# Patient Record
Sex: Female | Born: 1995 | Hispanic: Yes | Marital: Single | Smoking: Former smoker
Health system: Southern US, Community
[De-identification: ages and names within clinical notes are randomized; demographics above are authoritative.]

## PROBLEM LIST (undated history)

## (undated) ENCOUNTER — Inpatient Hospital Stay (HOSPITAL_COMMUNITY): Payer: Self-pay

## (undated) DIAGNOSIS — Z789 Other specified health status: Secondary | ICD-10-CM

## (undated) DIAGNOSIS — R87629 Unspecified abnormal cytological findings in specimens from vagina: Secondary | ICD-10-CM

## (undated) DIAGNOSIS — O24419 Gestational diabetes mellitus in pregnancy, unspecified control: Secondary | ICD-10-CM

## (undated) DIAGNOSIS — F32A Depression, unspecified: Secondary | ICD-10-CM

## (undated) DIAGNOSIS — D649 Anemia, unspecified: Secondary | ICD-10-CM

## (undated) HISTORY — PX: BUNIONECTOMY: SHX129

---
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
## 2014-03-20 DIAGNOSIS — S42009A Fracture of unspecified part of unspecified clavicle, initial encounter for closed fracture: Secondary | ICD-10-CM

## 2014-03-20 DIAGNOSIS — S72009A Fracture of unspecified part of neck of unspecified femur, initial encounter for closed fracture: Secondary | ICD-10-CM

## 2014-03-20 HISTORY — DX: Fracture of unspecified part of unspecified clavicle, initial encounter for closed fracture: S42.009A

## 2014-03-20 HISTORY — DX: Fracture of unspecified part of neck of unspecified femur, initial encounter for closed fracture: S72.009A

## 2014-04-17 DIAGNOSIS — S32301A Unspecified fracture of right ilium, initial encounter for closed fracture: Secondary | ICD-10-CM | POA: Insufficient documentation

## 2014-04-17 DIAGNOSIS — S42022A Displaced fracture of shaft of left clavicle, initial encounter for closed fracture: Secondary | ICD-10-CM | POA: Insufficient documentation

## 2014-05-20 DIAGNOSIS — R87629 Unspecified abnormal cytological findings in specimens from vagina: Secondary | ICD-10-CM

## 2014-05-20 HISTORY — DX: Unspecified abnormal cytological findings in specimens from vagina: R87.629
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
## 2016-02-19 DIAGNOSIS — T1491XA Suicide attempt, initial encounter: Secondary | ICD-10-CM | POA: Insufficient documentation

## 2016-09-04 ENCOUNTER — Encounter (HOSPITAL_COMMUNITY): Payer: Self-pay | Admitting: *Deleted

## 2016-09-04 ENCOUNTER — Inpatient Hospital Stay (HOSPITAL_COMMUNITY)
Admission: AD | Admit: 2016-09-04 | Discharge: 2016-09-04 | Disposition: A | Discharge status: Home / Self Care | Payer: Self-pay | Source: Ambulatory Visit | Attending: Obstetrics & Gynecology | Admitting: Obstetrics & Gynecology

## 2016-09-04 ENCOUNTER — Inpatient Hospital Stay (HOSPITAL_COMMUNITY): Payer: Self-pay

## 2016-09-04 DIAGNOSIS — N760 Acute vaginitis: Secondary | ICD-10-CM

## 2016-09-04 DIAGNOSIS — B9689 Other specified bacterial agents as the cause of diseases classified elsewhere: Secondary | ICD-10-CM | POA: Insufficient documentation

## 2016-09-04 DIAGNOSIS — Z3491 Encounter for supervision of normal pregnancy, unspecified, first trimester: Secondary | ICD-10-CM

## 2016-09-04 DIAGNOSIS — O209 Hemorrhage in early pregnancy, unspecified: Secondary | ICD-10-CM | POA: Insufficient documentation

## 2016-09-04 DIAGNOSIS — Z3A08 8 weeks gestation of pregnancy: Secondary | ICD-10-CM | POA: Insufficient documentation

## 2016-09-04 DIAGNOSIS — O23591 Infection of other part of genital tract in pregnancy, first trimester: Secondary | ICD-10-CM | POA: Insufficient documentation

## 2016-09-04 HISTORY — DX: Other specified health status: Z78.9

## 2016-09-04 LAB — CBC
HCT: 38.1 % (ref 36.0–46.0)
Hemoglobin: 12.9 g/dL (ref 12.0–15.0)
MCH: 29.8 pg (ref 26.0–34.0)
MCHC: 33.9 g/dL (ref 30.0–36.0)
MCV: 88 fL (ref 78.0–100.0)
PLATELETS: 221 10*3/uL (ref 150–400)
RBC: 4.33 MIL/uL (ref 3.87–5.11)
RDW: 13.9 % (ref 11.5–15.5)
WBC: 8.1 10*3/uL (ref 4.0–10.5)

## 2016-09-04 LAB — WET PREP, GENITAL
Sperm: NONE SEEN
Trich, Wet Prep: NONE SEEN
Yeast Wet Prep HPF POC: NONE SEEN

## 2016-09-04 LAB — URINALYSIS, ROUTINE W REFLEX MICROSCOPIC
BACTERIA UA: NONE SEEN
Bilirubin Urine: NEGATIVE
GLUCOSE, UA: NEGATIVE mg/dL
Hgb urine dipstick: NEGATIVE
KETONES UR: NEGATIVE mg/dL
NITRITE: NEGATIVE
PROTEIN: NEGATIVE mg/dL
Specific Gravity, Urine: 1.017 (ref 1.005–1.030)
pH: 6 (ref 5.0–8.0)

## 2016-09-04 LAB — HCG, QUANTITATIVE, PREGNANCY: HCG, BETA CHAIN, QUANT, S: 22840 m[IU]/mL — AB (ref <5)

## 2016-09-04 LAB — POCT PREGNANCY, URINE: Preg Test, Ur: POSITIVE — AB

## 2016-09-04 LAB — ABO/RH: ABO/RH(D): O POS

## 2016-09-04 MED ORDER — METRONIDAZOLE 500 MG PO TABS: 500.0000 mg | ORAL_TABLET | Freq: Two times a day (BID) | ORAL | 0 refills | Status: DC

## 2016-09-04 NOTE — MAU Note (Addendum)
Pt started spotting at work this morning, bright red.  Is more light pink now.  Having lower abd pain since Monday.  Had pos HPT last week.

## 2016-09-04 NOTE — MAU Provider Note (Signed)
History     CSN: [id number]  Arrival date and time: 09/04/16 1618   First Provider Initiated Contact with Patient 09/04/16 1935      Chief Complaint  Patient presents with  . Vaginal Bleeding  . Abdominal Pain   HPI   Ms. Gina Lucas is a 21 y.o. female G1P0 @ [redacted]w[redacted]d here in MAU with vaginal bleeding. The bleeding started this morning at 0900. She describes the bleeding as light. This is the first episode of bleeding in this pregnancy. She denies recent intercourse. Some lower abdominal pain, worse in the center of her abdomen. She denies bleeding at this time.   OB History    Gravida Para Term Preterm AB Living   1             SAB TAB Ectopic Multiple Live Births                  Past Medical History:  Diagnosis Date  . Medical history non-contributory     Past Surgical History:  Procedure Laterality Date  . BUNIONECTOMY Right     History reviewed. No pertinent family history.  Social History  Substance Use Topics  . Smoking status: Never Smoker  . Smokeless tobacco: Never Used  . Alcohol use No    Allergies: No Known Allergies  Prescriptions Prior to Admission  Medication Sig Dispense Refill Last Dose  . HYDROcodone-acetaminophen (NORCO/VICODIN) 5-325 MG per tablet Take 1-2 tablets by mouth every 6 hours as needed for pain. 15 tablet 0    Results for orders placed or performed during the hospital encounter of 09/04/16 (from the past 48 hour(s))  Urinalysis, Routine w reflex microscopic     Status: Abnormal   Collection Time: 09/04/16  4:44 PM  Result Value Ref Range   Color, Urine YELLOW YELLOW   APPearance HAZY (A) CLEAR   Specific Gravity, Urine 1.017 1.005 - 1.030   pH 6.0 5.0 - 8.0   Glucose, UA NEGATIVE NEGATIVE mg/dL   Hgb urine dipstick NEGATIVE NEGATIVE   Bilirubin Urine NEGATIVE NEGATIVE   Ketones, ur NEGATIVE NEGATIVE mg/dL   Protein, ur NEGATIVE NEGATIVE mg/dL   Nitrite NEGATIVE NEGATIVE   Leukocytes, UA TRACE (A) NEGATIVE   RBC / HPF  0-5 0 - 5 RBC/hpf   WBC, UA 0-5 0 - 5 WBC/hpf   Bacteria, UA NONE SEEN NONE SEEN   Squamous Epithelial / LPF 0-5 (A) NONE SEEN  Pregnancy, urine POC     Status: Abnormal   Collection Time: 09/04/16  5:00 PM  Result Value Ref Range   Preg Test, Ur POSITIVE (A) NEGATIVE    Comment:        THE SENSITIVITY OF THIS METHODOLOGY IS >24 mIU/mL   CBC     Status: None   Collection Time: 09/04/16  5:39 PM  Result Value Ref Range   WBC 8.1 4.0 - 10.5 K/uL   RBC 4.33 3.87 - 5.11 MIL/uL   Hemoglobin 12.9 12.0 - 15.0 g/dL   HCT 40.9 81.1 - 91.4 %   MCV 88.0 78.0 - 100.0 fL   MCH 29.8 26.0 - 34.0 pg   MCHC 33.9 30.0 - 36.0 g/dL   RDW 78.2 95.6 - 21.3 %   Platelets 221 150 - 400 K/uL  ABO/Rh     Status: None (Preliminary result)   Collection Time: 09/04/16  5:39 PM  Result Value Ref Range   ABO/RH(D) O POS   hCG, quantitative, pregnancy  Status: Abnormal   Collection Time: 09/04/16  5:39 PM  Result Value Ref Range   hCG, Beta Chain, Quant, S 22,840 (H) <5 mIU/mL    Comment:          GEST. AGE      CONC.  (mIU/mL)   <=1 WEEK        5 - 50     2 WEEKS       50 - 500     3 WEEKS       100 - 10,000     4 WEEKS     1,000 - 30,000     5 WEEKS     3,500 - 115,000   6-8 WEEKS     12,000 - 270,000    12 WEEKS     15,000 - 220,000        FEMALE AND NON-PREGNANT FEMALE:     LESS THAN 5 mIU/mL   Wet prep, genital     Status: Abnormal   Collection Time: 09/04/16  7:24 PM  Result Value Ref Range   Yeast Wet Prep HPF POC NONE SEEN NONE SEEN   Trich, Wet Prep NONE SEEN NONE SEEN   Clue Cells Wet Prep HPF POC PRESENT (A) NONE SEEN   WBC, Wet Prep HPF POC MANY (A) NONE SEEN    Comment: MODERATE BACTERIA SEEN   Sperm NONE SEEN    Review of Systems  Gastrointestinal: Positive for abdominal pain. Negative for nausea and vomiting.  Genitourinary: Negative for vaginal bleeding (None currently ).   Physical Exam   Blood pressure 129/68, pulse 86, temperature 98.2 F (36.8 C), temperature source  Oral, resp. rate 18, height  (1.702 m), weight 264 lb (119.7 kg), last menstrual period 07/04/2016.  Physical Exam  Constitutional: She is oriented to person, place, and time. She appears well-developed and well-nourished. No distress.  HENT:  Head: Normocephalic.  Eyes: Pupils are equal, round, and reactive to light.  Respiratory: Effort normal.  GI: Soft.  Genitourinary:  Genitourinary Comments: Bimanual exam: Cervix closed, anterior. Uterus non tender, enlarged  GC/Chlam, wet prep done collected by RN.  Chaperone present for exam.   Musculoskeletal: Normal range of motion.  Neurological: She is alert and oriented to person, place, and time.  Skin: Skin is warm. She is not diaphoretic.  Psychiatric: Her behavior is normal.    MAU Course  Procedures  US Ob Comp Less 14 Wks  Result Date: 09/04/2016 CLINICAL DATA:  21 year old pregnant female with vaginal bleeding and pelvic pain. Estimated gestational age of [redacted] weeks 6 days by LMP. Beta HCG of 22,000. EXAM: OBSTETRIC <14 WK [place] AND TRANSVAGINAL OB US TECHNIQUE: Both transabdominal and transvaginal ultrasound examinations were performed for complete evaluation of the gestation as well as the maternal uterus, adnexal regions, and pelvic cul-de-sac. Transvaginal technique was performed to assess early pregnancy. COMPARISON:  None. FINDINGS: Intrauterine gestational sac: Single Yolk sac:  Visualized. Embryo:  Visualized. Cardiac Activity: Visualized. Heart Rate: 138  bpm CRL:  14  mm   7 w   4 d                  [place] EDC: 04/19/2017 Subchorionic hemorrhage:  None visualized. Maternal uterus/adnexae: The ovaries bilaterally are unremarkable. There is no evidence of free fluid or adnexal mass. IMPRESSION: Single living intrauterine gestation with estimated gestational age of [redacted] weeks 4 days by this ultrasound. No evidence of subchorionic hemorrhage. Normal ovaries.  No adnexal mass or  free fluid. Electronically Signed   By: Harmon Pier M.D.   On:  09/04/2016 20:15   US Ob Transvaginal  Result Date: 09/04/2016 CLINICAL DATA:  21 year old pregnant female with vaginal bleeding and pelvic pain. Estimated gestational age of [redacted] weeks 6 days by LMP. Beta HCG of 22,000. EXAM: OBSTETRIC <14 WK [place] AND TRANSVAGINAL OB US TECHNIQUE: Both transabdominal and transvaginal ultrasound examinations were performed for complete evaluation of the gestation as well as the maternal uterus, adnexal regions, and pelvic cul-de-sac. Transvaginal technique was performed to assess early pregnancy. COMPARISON:  None. FINDINGS: Intrauterine gestational sac: Single Yolk sac:  Visualized. Embryo:  Visualized. Cardiac Activity: Visualized. Heart Rate: 138  bpm CRL:  14  mm   7 w   4 d                  [place] EDC: 04/19/2017 Subchorionic hemorrhage:  None visualized. Maternal uterus/adnexae: The ovaries bilaterally are unremarkable. There is no evidence of free fluid or adnexal mass. IMPRESSION: Single living intrauterine gestation with estimated gestational age of [redacted] weeks 4 days by this ultrasound. No evidence of subchorionic hemorrhage. Normal ovaries.  No adnexal mass or free fluid. Electronically Signed   By: Harmon Pier M.D.   On: 09/04/2016 20:15     MDM  Cbc, Conni Elliot, Quant [place] ordered [employer] prep and GC  Report given to Judeth Horn NP who resumes care of the patient. Patient awaiting [place].  Duane Lope, NP  Ultrasound shows SIUP @ [redacted]w[redacted]d with cardiac activity Will tx for BV O positive Assessment and Plan  A: 1. Normal IUP (intrauterine pregnancy) on prenatal ultrasound, first trimester   2. Vaginal bleeding in pregnancy, first trimester   3. BV (bacterial vaginosis)    P: Discharge home Rx flagyl Discussed reasons to return to MAU Keep follow up appointment with OB/PCP  GC/CT & urine cx pending  Judeth Horn, NP

## 2016-09-04 NOTE — Discharge Instructions (Signed)
[facility] Prenatal Care Providers   Center for [facility] at [facility]       Phone: [phone]  Center for [facility] [place] Phone: [phone]  Center for [facility] at [place]  Phone: [phone]  Center for [facility] at [facility]  Phone: [phone]  Center for [facility] at [facility]  Phone: [phone]  [facility]       Phone: [phone]  [facility]    Phone: [phone]   [facility])    Phone: [phone]  [facility]    Phone: [phone]  [facility]    Phone: [phone]   [facility]  Phone: [phone]  [facility]    Phone: [phone]  [facility]   Phone: [phone]  [facility]    Phone: [phone]    Bacterial Vaginosis Bacterial vaginosis is a vaginal infection that occurs when the normal balance of bacteria in the vagina is disrupted. It results from an overgrowth of certain bacteria. This is the most common vaginal infection among women ages 54-44. Because bacterial vaginosis increases your risk for STIs (sexually transmitted infections), getting treated can help reduce your risk for chlamydia, gonorrhea, herpes, and HIV (human immunodeficiency virus). Treatment is also important for preventing complications in pregnant women, because this condition can cause an early (premature) delivery. What are the causes? This condition is caused by an increase in harmful bacteria that are normally present in small amounts in the vagina. However, the reason that the condition develops is not fully understood. What increases the risk? The following factors may make you more likely to develop this condition:  Having a new sexual partner or multiple sexual partners.  Having unprotected  sex.  Douching.  Having an intrauterine device (IUD).  Smoking.  Drug and alcohol abuse.  Taking certain antibiotic medicines.  Being pregnant. You cannot get bacterial vaginosis from toilet seats, bedding, swimming pools, or contact with objects around you. What are the signs or symptoms? Symptoms of this condition include:  Grey or white vaginal discharge. The discharge can also be watery or foamy.  A fish-like odor with discharge, especially after sexual intercourse or during menstruation.  Itching in and around the vagina.  Burning or pain with urination. Some women with bacterial vaginosis have no signs or symptoms. How is this diagnosed? This condition is diagnosed based on:  Your medical history.  A physical exam of the vagina.  Testing a sample of vaginal fluid under a microscope to look for a large amount of bad bacteria or abnormal cells. Your health care provider may use a cotton swab or a small wooden spatula to collect the sample. How is this treated? This condition is treated with antibiotics. These may be given as a pill, a vaginal cream, or a medicine that is put into the vagina (suppository). If the condition comes back after treatment, a second round of antibiotics may be needed. Follow these instructions at home: Medicines   Take over-the-counter and prescription medicines only as told by your health care provider.  Take or use your antibiotic as told by your health care provider. Do not stop taking or using the antibiotic even if you start to feel better. General instructions   If you have a female sexual partner, tell her that you have a vaginal infection. She should see her health care provider and be treated if she has symptoms.  If you have a female sexual partner, he does not need treatment.  During treatment:  Avoid sexual activity until you finish treatment.  Do not douche.  Avoid alcohol as directed by your health care provider.  Avoid  breastfeeding as directed by your health care provider.  Drink enough water and fluids to keep your urine clear or pale yellow.  Keep the area around your vagina and rectum clean.  Wash the area daily with warm water.  Wipe yourself from front to back after using the toilet.  Keep all follow-up visits as told by your health care provider. This is important. How is this prevented?  Do not douche.  Wash the outside of your vagina with warm water only.  Use protection when having sex. This includes latex condoms and dental dams.  Limit how many sexual partners you have. To help prevent bacterial vaginosis, it is best to have sex with just one partner (monogamous).  Make sure you and your sexual partner are tested for STIs.  Wear cotton or cotton-lined underwear.  Avoid wearing tight pants and pantyhose, especially during summer.  Limit the amount of alcohol that you drink.  Do not use any products that contain nicotine or tobacco, such as cigarettes and e-cigarettes. If you need help quitting, ask your health care provider.  Do not use illegal drugs. Where to find more information:  Centers for Disease Control and Prevention: SolutionApps.co.za  American Sexual Health Association (ASHA): www.ashastd.org  U.S. Department of Health and Health and safety inspector, Office on Women's Health: ConventionalMedicines.si or http://www.anderson-williamson.info/ Contact a health care provider if:  Your symptoms do not improve, even after treatment.  You have more discharge or pain when urinating.  You have a fever.  You have pain in your abdomen.  You have pain during sex.  You have vaginal bleeding between periods. Summary  Bacterial vaginosis is a vaginal infection that occurs when the normal balance of bacteria in the vagina is disrupted.  Because bacterial vaginosis increases your risk for STIs (sexually transmitted infections), getting treated can help reduce your  risk for chlamydia, gonorrhea, herpes, and HIV (human immunodeficiency virus). Treatment is also important for preventing complications in pregnant women, because the condition can cause an early (premature) delivery.  This condition is treated with antibiotic medicines. These may be given as a pill, a vaginal cream, or a medicine that is put into the vagina (suppository). This information is not intended to replace advice given to you by your health care provider. Make sure you discuss any questions you have with your health care provider. Document Released: 05/06/2005 Document Revised: 01/20/2016 Document Reviewed: 01/20/2016 Elsevier Interactive Patient Education  2017 [employer].

## 2016-09-05 LAB — GC/CHLAMYDIA PROBE AMP (~~LOC~~) NOT AT ARMC
Chlamydia: NEGATIVE
Neisseria Gonorrhea: NEGATIVE

## 2016-09-05 LAB — RPR: RPR: NONREACTIVE

## 2016-09-05 LAB — HIV ANTIBODY (ROUTINE TESTING W REFLEX): HIV SCREEN 4TH GENERATION: NONREACTIVE

## 2016-09-06 LAB — CULTURE, OB URINE
Culture: NO GROWTH
Special Requests: NORMAL

## 2016-09-20 ENCOUNTER — Encounter (HOSPITAL_COMMUNITY): Payer: Self-pay | Admitting: *Deleted

## 2016-09-20 ENCOUNTER — Inpatient Hospital Stay (HOSPITAL_COMMUNITY): Payer: Self-pay

## 2016-09-20 ENCOUNTER — Inpatient Hospital Stay (HOSPITAL_COMMUNITY)
Admission: AD | Admit: 2016-09-20 | Discharge: 2016-09-20 | Disposition: A | Discharge status: Home / Self Care | Payer: Self-pay | Source: Ambulatory Visit | Attending: Obstetrics and Gynecology | Admitting: Obstetrics and Gynecology

## 2016-09-20 DIAGNOSIS — O209 Hemorrhage in early pregnancy, unspecified: Secondary | ICD-10-CM | POA: Insufficient documentation

## 2016-09-20 DIAGNOSIS — O4690 Antepartum hemorrhage, unspecified, unspecified trimester: Secondary | ICD-10-CM

## 2016-09-20 DIAGNOSIS — Z3A09 9 weeks gestation of pregnancy: Secondary | ICD-10-CM | POA: Insufficient documentation

## 2016-09-20 DIAGNOSIS — Z6790 Unspecified blood type, Rh positive: Secondary | ICD-10-CM

## 2016-09-20 DIAGNOSIS — O021 Missed abortion: Secondary | ICD-10-CM | POA: Insufficient documentation

## 2016-09-20 LAB — CBC
HCT: 39.5 % (ref 36.0–46.0)
Hemoglobin: 13.3 g/dL (ref 12.0–15.0)
MCH: 29.5 pg (ref 26.0–34.0)
MCHC: 33.7 g/dL (ref 30.0–36.0)
MCV: 87.6 fL (ref 78.0–100.0)
Platelets: 228 10*3/uL (ref 150–400)
RBC: 4.51 MIL/uL (ref 3.87–5.11)
RDW: 13.5 % (ref 11.5–15.5)
WBC: 8.9 10*3/uL (ref 4.0–10.5)

## 2016-09-20 LAB — HCG, QUANTITATIVE, PREGNANCY: hCG, Beta Chain, Quant, S: 7444 m[IU]/mL — ABNORMAL HIGH (ref <5)

## 2016-09-20 MED ORDER — ACETAMINOPHEN-CODEINE #3 300-30 MG PO TABS: 1.0000 | ORAL_TABLET | Freq: Four times a day (QID) | ORAL | 0 refills | Status: DC | PRN

## 2016-09-20 MED ORDER — ACETAMINOPHEN-CODEINE #3 300-30 MG PO TABS
1.0000 | ORAL_TABLET | Freq: Four times a day (QID) | ORAL | Status: DC | PRN
  Administered 2016-09-20: 2 via ORAL
  Filled 2016-09-20: qty 2

## 2016-09-20 MED ORDER — IBUPROFEN 600 MG PO TABS
600.0000 mg | ORAL_TABLET | Freq: Four times a day (QID) | ORAL | Status: DC | PRN
  Administered 2016-09-20: 600 mg via ORAL
  Filled 2016-09-20: qty 1

## 2016-09-20 MED ORDER — IBUPROFEN 600 MG PO TABS: 600.0000 mg | ORAL_TABLET | Freq: Four times a day (QID) | ORAL | 0 refills | Status: DC | PRN

## 2016-09-20 MED ORDER — MISOPROSTOL 200 MCG PO TABS
800.0000 ug | ORAL_TABLET | Freq: Once | ORAL | Status: AC
  Administered 2016-09-20: 800 ug via RECTAL
  Filled 2016-09-20: qty 4

## 2016-09-20 MED ORDER — PROMETHAZINE HCL 25 MG PO TABS
25.0000 mg | ORAL_TABLET | Freq: Four times a day (QID) | ORAL | 0 refills | Status: DC | PRN
Start: 2016-09-20 — End: 2018-12-02

## 2016-09-20 NOTE — MAU Note (Signed)
c/o intermittent pink spottingfor past 3 days with bleeding starting around 1200 yesterday;  c/o cramping that started yesterday with pain increasing with ambulation;

## 2016-09-20 NOTE — Discharge Instructions (Signed)

## 2016-09-20 NOTE — MAU Provider Note (Signed)
History     CSN: [id number]  Arrival date and time: 09/20/16 4098  Chief Complaint  Patient presents with  . Vaginal Bleeding   G1 @[redacted]w[redacted]d  here with VB. She reports spotting for the last 3 days then began bleeding yesterday. This morning bleeding has increased. She also reports cramping that started yesterday and has continued today. She has not used anything for the pain. Admits to IC about 2 days ago. She was seen about 2 weeks ago and had a viable IUP consistent with dates and no [facility].     OB History    Gravida Para Term Preterm AB Living   1             SAB TAB Ectopic Multiple Live Births                  Past Medical History:  Diagnosis Date  . Medical history non-contributory     Past Surgical History:  Procedure Laterality Date  . BUNIONECTOMY Right     No family history on file.  Social History  Substance Use Topics  . Smoking status: Never Smoker  . Smokeless tobacco: Never Used  . Alcohol use No    Allergies: No Known Allergies  Prescriptions Prior to Admission  Medication Sig Dispense Refill Last Dose  . Prenatal Vit-Fe Fumarate-FA (PRENATAL MULTIVITAMIN) TABS tablet Take 1 tablet by mouth daily at 12 noon.   Past Week at Unknown time  . metroNIDAZOLE (FLAGYL) 500 MG tablet Take 1 tablet (500 mg total) by mouth 2 (two) times daily. (Patient not taking: Reported on 09/20/2016) 14 tablet 0 Not Taking at Unknown time    Review of Systems  Constitutional: Negative.   Gastrointestinal: Positive for abdominal pain (cramping).  Genitourinary: Positive for vaginal bleeding.   Physical Exam   Blood pressure 126/75, pulse 89, temperature 98.6 F (37 C), temperature source Oral, resp. rate 18, last menstrual period 07/04/2016.  Physical Exam  Nursing note and vitals reviewed. Constitutional: She is oriented to person, place, and time. She appears well-developed and well-nourished. No distress.  HENT:  Head: Normocephalic and atraumatic.  Neck: Normal range  of motion.  Cardiovascular: Normal rate.   Respiratory: Effort normal.  GI: Soft. She exhibits no distension and no mass. There is no tenderness. There is no rebound and no guarding.  Genitourinary:  Genitourinary Comments: External: no lesions or erythema Vagina: rugated, nulli, small drk red discharge, small bits of grey tissue present Cervix closed/long   Musculoskeletal: Normal range of motion.  Neurological: She is alert and oriented to person, place, and time.  Skin: Skin is warm and dry.  Psychiatric: She has a normal mood and affect.   Results for orders placed or performed during the hospital encounter of 09/20/16 (from the past 24 hour(s))  CBC     Status: None   Collection Time: 09/20/16  8:48 AM  Result Value Ref Range   WBC 8.9 4.0 - 10.5 K/uL   RBC 4.51 3.87 - 5.11 MIL/uL   Hemoglobin 13.3 12.0 - 15.0 g/dL   HCT 11.9 14.7 - 82.9 %   MCV 87.6 78.0 - 100.0 fL   MCH 29.5 26.0 - 34.0 pg   MCHC 33.7 30.0 - 36.0 g/dL   RDW 56.2 13.0 - 86.5 %   Platelets 228 150 - 400 K/uL   US Ob Transvaginal  Result Date: 09/20/2016 CLINICAL DATA:  Early pregnancy, bleeding EXAM: TRANSVAGINAL OB ULTRASOUND TECHNIQUE: Transvaginal ultrasound was performed for complete evaluation of  the gestation as well as the maternal uterus, adnexal regions, and pelvic cul-de-sac. COMPARISON:  09/04/2016 FINDINGS: Intrauterine gestational sac: Single Yolk sac:  Not visualized Embryo:  Visualized Cardiac Activity: Not visualized Heart Rate:  bpm MSD:   mm    w     d CRL:   15  mm   7 w 5 d                  US EDC: 05/04/2017 Subchorionic hemorrhage:  None visualized. Maternal uterus/adnexae: No adnexal mass.  Trace free fluid. IMPRESSION: No progression of the intrauterine gestation since prior study 2-3 weeks ago. No detectable cardiac activity. Findings meet definitive criteria for failed pregnancy. This follows SRU consensus guidelines: Diagnostic Criteria for Nonviable Pregnancy Early in the First Trimester.  Macy Mis Engl J Med [phone]. Electronically Signed   By: Charlett NoseKevin  Dover M.D.   On: 09/20/2016 09:30    MAU Course  Procedures  MDM Labs and US ordered and reviewed. US consistent with failed pregnancy. Discussed options with pt and SO, she prefers medical mngt.  Early Intrauterine Pregnancy Failure Protocol X  Documented intrauterine pregnancy failure less than or equal to [redacted] weeks   gestation  X  No serious current illness  X  Baseline Hgb greater than or equal to 10g/dl  X  Patient has easily accessible transportation to the hospital  X  Clear preference  X  Practitioner/physician deems patient reliable  X  Counseling by practitioner or physician  X  Patient education by RN  X  Consent form signed       Rho-Gam given by RN if indicated  X  Medication dispensed  X  Cytotec 800 mcg        Intravaginally by NP in MAU       Rectally by patient at home   X   Rectally by RN in MAU  X   Ibuprofen 600 mg 1 tablet by mouth every 6 hours as needed #30 - prescribed  X   Tylenol #3 mg by mouth every 4 to 6 hours as needed - prescribed  X   Phenergan 12.5 mg by mouth every 4 hours as needed for nausea - prescribed  Reviewed with pt cytotec procedure.  Pt verbalizes that she lives close to the hospital and has transportation readily available. Pt appears reliable and verbalizes understanding and agrees with plan of care  Assessment and Plan   1. Missed abortion   2. Vaginal bleeding in pregnancy   3. Blood type, Rh positive    Discharge home Follow up in [facility] in 2 weeks Bleeding/return precautions Rx Tylenol #3 Rx Ibuprofen Rx Phenergan  Allergies as of 09/20/2016   No Known Allergies     Medication List    STOP taking these medications   metroNIDAZOLE 500 MG tablet Commonly known as:  FLAGYL     TAKE these medications   acetaminophen-codeine 300-30 MG tablet Commonly known as:  TYLENOL #3 Take 1-2 tablets by mouth every 6 (six) hours as needed for moderate pain.    ibuprofen 600 MG tablet Commonly known as:  ADVIL,MOTRIN Take 1 tablet (600 mg total) by mouth every 6 (six) hours as needed for cramping.   prenatal multivitamin Tabs tablet Take 1 tablet by mouth daily at 12 noon.   promethazine 25 MG tablet Commonly known as:  PHENERGAN Take 1 tablet (25 mg total) by mouth every 6 (six) hours as needed for nausea or vomiting.  Donette Larry, CNM 09/20/2016, 9:22 AM

## 2016-09-21 ENCOUNTER — Inpatient Hospital Stay (HOSPITAL_COMMUNITY): Payer: Self-pay

## 2016-09-21 ENCOUNTER — Encounter (HOSPITAL_COMMUNITY): Payer: Self-pay | Admitting: *Deleted

## 2016-09-21 ENCOUNTER — Inpatient Hospital Stay (HOSPITAL_COMMUNITY)
Admission: AD | Admit: 2016-09-21 | Discharge: 2016-09-21 | Disposition: A | Discharge status: Home / Self Care | Payer: Self-pay | Source: Ambulatory Visit | Attending: Obstetrics and Gynecology | Admitting: Obstetrics and Gynecology

## 2016-09-21 DIAGNOSIS — O031 Delayed or excessive hemorrhage following incomplete spontaneous abortion: Secondary | ICD-10-CM | POA: Insufficient documentation

## 2016-09-21 DIAGNOSIS — O209 Hemorrhage in early pregnancy, unspecified: Secondary | ICD-10-CM

## 2016-09-21 DIAGNOSIS — IMO0002 Reserved for concepts with insufficient information to code with codable children: Secondary | ICD-10-CM

## 2016-09-21 DIAGNOSIS — O034 Incomplete spontaneous abortion without complication: Secondary | ICD-10-CM

## 2016-09-21 LAB — CBC
HCT: 37.5 % (ref 36.0–46.0)
HEMOGLOBIN: 12.7 g/dL (ref 12.0–15.0)
MCH: 30 pg (ref 26.0–34.0)
MCHC: 33.9 g/dL (ref 30.0–36.0)
MCV: 88.4 fL (ref 78.0–100.0)
Platelets: 226 10*3/uL (ref 150–400)
RBC: 4.24 MIL/uL (ref 3.87–5.11)
RDW: 13.5 % (ref 11.5–15.5)
WBC: 8.7 10*3/uL (ref 4.0–10.5)

## 2016-09-21 MED ORDER — MISOPROSTOL 200 MCG PO TABS
800.0000 ug | ORAL_TABLET | Freq: Once | ORAL | Status: AC
  Administered 2016-09-21: 800 ug via BUCCAL
  Filled 2016-09-21: qty 4

## 2016-09-21 NOTE — MAU Note (Signed)
Pt was here earlier today and given Cytotec due to baby stopped growing. Bleeding now with mod clot and soaked 3 pads in an hour. Was told to return if had large clots

## 2016-09-21 NOTE — Progress Notes (Signed)
Lisa Leftwich Kirby CNM in earlier to discuss d/c plan. Written and verbal d/c instructions given and understanding voiced. 

## 2016-09-21 NOTE — MAU Provider Note (Signed)
Chief Complaint: Vaginal Bleeding   First Provider Initiated Contact with Patient 09/21/16 [phone]      SUBJECTIVE HPI: Gina Lucas is a 21 y.o. G1P0 at [redacted]w[redacted]d by LMP who presents to maternity admissions reporting heavy vaginal bleeding with large clots following Cytotec administration yesterday in MAU for missed ab.  She reports the bleeding started soon after the medication was given and was like a heavy period but for the past few hours it has become heavy, soaking 3 pads in one hour with golf ball sized clots. She reports mild  abdominal cramping but denies n/v, dizziness, or other associated symptoms.  She has not tried any treatments.  Nothing makes the bleeding better or worse. She denies vaginal itching/burning, urinary symptoms, h/a, dizziness, n/v, or fever/chills.     HPI  Past Medical History:  Diagnosis Date  . Medical history non-contributory    Past Surgical History:  Procedure Laterality Date  . BUNIONECTOMY Right    Social History   Social History  . Marital status: Single    Spouse name: N/A  . Number of children: N/A  . Years of education: N/A   Occupational History  . Not on file.   Social History Main Topics  . Smoking status: Never Smoker  . Smokeless tobacco: Never Used  . Alcohol use No  . Drug use: No  . Sexual activity: Yes   Other Topics Concern  . Not on file   Social History Narrative  . No narrative on file   No current facility-administered medications on file prior to encounter.    Current Outpatient Prescriptions on File Prior to Encounter  Medication Sig Dispense Refill  . Prenatal Vit-Fe Fumarate-FA (PRENATAL MULTIVITAMIN) TABS tablet Take 1 tablet by mouth daily at 12 noon.    Marland Kitchen acetaminophen-codeine (TYLENOL #3) 300-30 MG tablet Take 1-2 tablets by mouth every 6 (six) hours as needed for moderate pain. 10 tablet 0  . ibuprofen (ADVIL,MOTRIN) 600 MG tablet Take 1 tablet (600 mg total) by mouth every 6 (six) hours as needed for  cramping. 30 tablet 0  . promethazine (PHENERGAN) 25 MG tablet Take 1 tablet (25 mg total) by mouth every 6 (six) hours as needed for nausea or vomiting. 30 tablet 0   No Known Allergies  ROS:  Review of Systems  Constitutional: Negative for chills, fatigue and fever.  Respiratory: Negative for shortness of breath.   Cardiovascular: Negative for chest pain.  Gastrointestinal: Negative for nausea and vomiting.  Genitourinary: Positive for vaginal bleeding. Negative for difficulty urinating, dysuria, flank pain, pelvic pain, vaginal discharge and vaginal pain.  Neurological: Negative for dizziness and headaches.  Psychiatric/Behavioral: Negative.      I have reviewed patient's Past Medical Hx, Surgical Hx, Family Hx, Social Hx, medications and allergies.   Physical Exam   Patient Vitals for the past 24 hrs:  BP Temp Pulse Resp Height Weight  09/21/16 0630 (!) 113/55 97.7 F (36.5 C) 72 18 - -  09/21/16 0352 133/62 98.1 F (36.7 C) 82 20 5\' 7"  (1.702 m) 266 lb (120.7 kg)   Constitutional: Well-developed, well-nourished female in no acute distress.  Cardiovascular: normal rate Respiratory: normal effort GI: Abd soft, non-tender. Pos BS x 4 MS: Extremities nontender, no edema, normal ROM Neurologic: Alert and oriented x 4.  GU: Neg CVAT.  PELVIC EXAM: Heavy bleeding with large clots noted during speculum exam, large 3-4cm gray/white material, likely POCs removed from cervical os during exam.  Bleeding continued following removal of  clot/POCs but no more clots noted.    After pelvic exam and US, pt bleeding is still moderate, but soaking 1 pad in ~ 1 hour in MAU.     LAB RESULTS Results for orders placed or performed during the hospital encounter of 09/21/16 (from the past 24 hour(s))  CBC     Status: None   Collection Time: 09/21/16  4:51 AM  Result Value Ref Range   WBC 8.7 4.0 - 10.5 K/uL   RBC 4.24 3.87 - 5.11 MIL/uL   Hemoglobin 12.7 12.0 - 15.0 g/dL   HCT 45.437.5 09.836.0 -  11.946.0 %   MCV 88.4 78.0 - 100.0 fL   MCH 30.0 26.0 - 34.0 pg   MCHC 33.9 30.0 - 36.0 g/dL   RDW 14.713.5 82.911.5 - 56.215.5 %   Platelets 226 150 - 400 K/uL    --/--/O POS (04/18 1739)  IMAGING [place] Ob Comp Less 14 Wks  Result Date: 09/04/2016 CLINICAL DATA:  21 year old pregnant female with vaginal bleeding and pelvic pain. Estimated gestational age of [redacted] weeks 6 days by LMP. Beta HCG of 22,000. EXAM: OBSTETRIC <14 WK US AND TRANSVAGINAL OB US TECHNIQUE: Both transabdominal and transvaginal ultrasound examinations were performed for complete evaluation of the gestation as well as the maternal uterus, adnexal regions, and pelvic cul-de-sac. Transvaginal technique was performed to assess early pregnancy. COMPARISON:  None. FINDINGS: Intrauterine gestational sac: Single Yolk sac:  Visualized. Embryo:  Visualized. Cardiac Activity: Visualized. Heart Rate: 138  bpm CRL:  14  mm   7 w   4 d                  US EDC: 04/19/2017 Subchorionic hemorrhage:  None visualized. Maternal uterus/adnexae: The ovaries bilaterally are unremarkable. There is no evidence of free fluid or adnexal mass. IMPRESSION: Single living intrauterine gestation with estimated gestational age of [redacted] weeks 4 days by this ultrasound. No evidence of subchorionic hemorrhage. Normal ovaries.  No adnexal mass or free fluid. Electronically Signed   By: Harmon PierJeffrey  Hu M.D.   On: 09/04/2016 20:15   [place] Ob Transvaginal  Result Date: 09/21/2016 CLINICAL DATA:  Vaginal bleeding, status post Cytotec yesterday. Beta HCG September 04, 2016 was 22,840, now 567,444. EXAM: TRANSVAGINAL OB ULTRASOUND TECHNIQUE: Transvaginal ultrasound was performed for complete evaluation of the gestation as well as the maternal uterus, adnexal regions, and pelvic cul-de-sac. COMPARISON:  Obstetric ultrasound Sep 20, 2016 FINDINGS: Intrauterine gestational sac: Present though now collapsed, within the lower uterine segment. Yolk sac:  Not present Embryo:  Not present Cardiac Activity: Not  applicable Subchorionic hemorrhage:  None visualized. Maternal uterus/adnexae: Normal appearance of the adnexae. Trace free fluid. IMPRESSION: Collapsed gestational sac now within the lower uterine segment most compatible with miscarriage in progress. Electronically Signed   By: Awilda Metroourtnay  Bloomer M.D.   On: 09/21/2016 05:47   [place] Ob Transvaginal  Result Date: 09/20/2016 CLINICAL DATA:  Early pregnancy, bleeding EXAM: TRANSVAGINAL OB ULTRASOUND TECHNIQUE: Transvaginal ultrasound was performed for complete evaluation of the gestation as well as the maternal uterus, adnexal regions, and pelvic cul-de-sac. COMPARISON:  09/04/2016 FINDINGS: Intrauterine gestational sac: Single Yolk sac:  Not visualized Embryo:  Visualized Cardiac Activity: Not visualized Heart Rate:  bpm MSD:   mm    w     d CRL:   15  mm   7 w 5 d                  US EDC: 05/04/2017 Subchorionic  hemorrhage:  None visualized. Maternal uterus/adnexae: No adnexal mass.  Trace free fluid. IMPRESSION: No progression of the intrauterine gestation since prior study 2-3 weeks ago. No detectable cardiac activity. Findings meet definitive criteria for failed pregnancy. This follows SRU consensus guidelines: Diagnostic Criteria for Nonviable Pregnancy Early in the First Trimester. Macy Mis J Med [phone]. Electronically Signed   By: Charlett Nose M.D.   On: 09/20/2016 09:30   US Ob Transvaginal  Result Date: 09/04/2016 CLINICAL DATA:  21 year old pregnant female with vaginal bleeding and pelvic pain. Estimated gestational age of [redacted] weeks 6 days by LMP. Beta HCG of 22,000. EXAM: OBSTETRIC <14 WK [place] AND TRANSVAGINAL OB US TECHNIQUE: Both transabdominal and transvaginal ultrasound examinations were performed for complete evaluation of the gestation as well as the maternal uterus, adnexal regions, and pelvic cul-de-sac. Transvaginal technique was performed to assess early pregnancy. COMPARISON:  None. FINDINGS: Intrauterine gestational sac: Single Yolk sac:   Visualized. Embryo:  Visualized. Cardiac Activity: Visualized. Heart Rate: 138  bpm CRL:  14  mm   7 w   4 d                  [place] EDC: 04/19/2017 Subchorionic hemorrhage:  None visualized. Maternal uterus/adnexae: The ovaries bilaterally are unremarkable. There is no evidence of free fluid or adnexal mass. IMPRESSION: Single living intrauterine gestation with estimated gestational age of [redacted] weeks 4 days by this ultrasound. No evidence of subchorionic hemorrhage. Normal ovaries.  No adnexal mass or free fluid. Electronically Signed   By: Harmon Pier M.D.   On: 09/04/2016 20:15    MAU Management/MDM: Ordered labs and [place] and reviewed results.  Pt bleeding did not resolve but reduced significantly while in MAU.  US shows gestational sac in lower uterine segment so second dose of Cytotec given, this time buccally, in MAU.  Pt preferred to go home since she feels well and will return if bleeding becomes too heavy or if she develops any concerning symptoms.  Bleeding precautions reviewed.  Pt to f/u in [facility] in 2 weeks as planned.   Treatments in MAU included Cytotec 800 mcg buccally x 1.  Pt has not picked up her prescribed pain or nausea medication. Encouraged pt to pick up meds in case she needs them.. Pt stable at time of discharge.  ASSESSMENT 1. Retained products of conception after miscarriage   2. Vaginal bleeding in pregnancy, first trimester   3. Hemorrhage due to retained products of conception     PLAN Discharge home with bleeding precautions  Allergies as of 09/21/2016   No Known Allergies     Medication List    TAKE these medications   acetaminophen-codeine 300-30 MG tablet Commonly known as:  TYLENOL #3 Take 1-2 tablets by mouth every 6 (six) hours as needed for moderate pain.   ibuprofen 600 MG tablet Commonly known as:  ADVIL,MOTRIN Take 1 tablet (600 mg total) by mouth every 6 (six) hours as needed for cramping.   prenatal multivitamin Tabs tablet Take 1 tablet by mouth daily  at 12 noon.   promethazine 25 MG tablet Commonly known as:  PHENERGAN Take 1 tablet (25 mg total) by mouth every 6 (six) hours as needed for nausea or vomiting.      Follow-up Information    Center for [facility] Follow up.   Specialty:  Obstetrics and Gynecology Why:  In 2 weeks, call to make appointment.  Return to MAU as needed for emergencies. Contact information: [address] [place] [address] [phone]          Sharen Counter Certified Nurse-Midwife 09/21/2016  6:47 AM

## 2016-09-21 NOTE — MAU Note (Signed)
Passed 4cm sized clot when got into MAU bed. Brought in smaller clot from home.

## 2016-10-08 ENCOUNTER — Encounter: Payer: Self-pay | Admitting: Medical

## 2016-10-08 ENCOUNTER — Ambulatory Visit (INDEPENDENT_AMBULATORY_CARE_PROVIDER_SITE_OTHER): Payer: Self-pay | Admitting: Medical

## 2016-10-08 ENCOUNTER — Ambulatory Visit: Payer: Self-pay | Admitting: Clinical

## 2016-10-08 VITALS — BP 131/74 | HR 81 | Ht 66.0 in | Wt 254.6 lb

## 2016-10-08 DIAGNOSIS — F4321 Adjustment disorder with depressed mood: Secondary | ICD-10-CM | POA: Insufficient documentation

## 2016-10-08 DIAGNOSIS — N898 Other specified noninflammatory disorders of vagina: Secondary | ICD-10-CM

## 2016-10-08 DIAGNOSIS — O039 Complete or unspecified spontaneous abortion without complication: Secondary | ICD-10-CM

## 2016-10-08 NOTE — BH Specialist Note (Signed)
Integrated Behavioral Health Initial Visit  MRN: [id number] Name: Gina BroomHaley A Pless   Session Start time: 10:49 Session End time: 10:59 Total time: 10 min  Type of Service: Integrated Behavioral Health- Individual/Family Interpretor:No. Interpretor Name and Language: n/a   Warm Hand Off Completed.       SUBJECTIVE: Gina Lucas is a 21 y.o. female accompanied by patient. Patient was referred by Vonzella NippleJulie Wenzel, PA-C for adjusting to loss. Patient reports the following symptoms/concerns: Pt primary symptoms in past two weeks have been difficulty falling asleep, anxiety, irritability, and fear of another miscarriage, but that she feels she is coping well, and is keeping a positive outlook. Prior to miscarriage, pt was not experiencing symptoms, and she expects them to go away with time. Duration of problem: About two weeks; Severity of problem: moderate  OBJECTIVE: Mood: Appropriate and Affect: Appropriate Risk of harm to self or others: No plan to harm self or others   LIFE CONTEXT: Family and Social: Supportive husband School/Work: - Self-Care: - Life Changes: Recent miscarriage  GOALS ADDRESSED: Patient will reduce symptoms of: grief and increase knowledge and/or ability of: self-management skills and also: Begin healthy grieving over loss   INTERVENTIONS: Supportive Counseling, Psychoeducation and/or Health Education and Link to [facility]  Standardized Assessments completed: GAD-7 and PHQ 9  ASSESSMENT: Patient currently experiencing Grief. Patient may benefit from brief supportive counseling regarding coping with adjusting to loss.  PLAN: 1. Follow up with behavioral health clinician on : As needed 2. Behavioral recommendations:  -Consider [facility] grief support, if needed -Consider sleep app for additional self-care strategy 3. Referral(s): [facility]  Valetta CloseJamie C [place], [place]  Depression screen [facility] 2/9 10/08/2016   Decreased Interest 2  Down, Depressed, Hopeless 1  PHQ - 2 Score 3  Altered sleeping 3  Tired, decreased energy 1  Change in appetite 2  Feeling bad or failure about yourself  1  Trouble concentrating 2  Moving slowly or fidgety/restless 0  Suicidal thoughts 0  PHQ-9 Score 12   GAD 7 : Generalized Anxiety Score 10/08/2016  Nervous, Anxious, on Edge 3  Control/stop worrying 2  Worry too much - different things 1  Trouble relaxing 2  Restless 0  Easily annoyed or irritable 3  Afraid - awful might happen 3  Total GAD 7 Score 14

## 2016-10-08 NOTE — Patient Instructions (Signed)

## 2016-10-08 NOTE — Progress Notes (Signed)
History:  Gina Lucas is a 21 y.o. G1P0 who presents to clinic today for follow-up after SAB. The patient was diagnosed with SAB on 09/20/16 and given Cytotec. She was seen in MAU on 09/21/16 with heavy bleeding. A sample of POC removed from the vagina were sent for pathology and positive for chorionic villi. The patient states scant spotting with wiping still noted. She denies pain or fever today. She has noted a mucus discharge and is concerned for BV.   The following portions of the patient's history were reviewed and updated as appropriate: allergies, current medications, family history, past medical history, social history, past surgical history and problem list.  Review of Systems:  Review of Systems  Constitutional: Negative for fever.  Gastrointestinal: Negative for abdominal pain.  Genitourinary:       + spotting, discharge      Objective:  Physical Exam BP 131/74   Pulse 81   Ht 5\' 6"  (1.676 m)   Wt 254 lb 9.6 oz (115.5 kg)   LMP 07/04/2016 (Approximate) Comment: IUD removed end of January  Breastfeeding? No Comment: MAB  BMI 41.09 kg/m  Physical Exam  Constitutional: She is oriented to person, place, and time. She appears well-developed and well-nourished. No distress.  HENT:  Head: Normocephalic.  Cardiovascular: Normal rate.   Pulmonary/Chest: Effort normal.  Abdominal: Soft. She exhibits no distension. There is no tenderness.  Genitourinary: There is no rash on the right labia. There is no rash on the left labia. Uterus is not enlarged and not tender. Cervix exhibits friability (mild). Cervix exhibits no motion tenderness and no discharge. Right adnexum displays no mass and no tenderness. Left adnexum displays no mass and no tenderness. There is bleeding (scant) in the vagina. Vaginal discharge (small mucus discharge noted) found.  Neurological: She is alert and oriented to person, place, and time.  Skin: Skin is warm and dry. No erythema.  Psychiatric: She has a  normal mood and affect. Her behavior is normal. Judgment and thought content normal.    Labs and Imaging Quant hCG and Aptima swab today    Assessment & Plan:  Assessment: SAB Vaginal discharge  Plans: Quant hCG today. Patient will be informed of results and need for additional testing if necessary  Aptima swab obtained. Patient advised discharge is likely physiologic, but will be informed of any abnormal results [facility], if symptoms were to change or worsen  Kathlene CoteWenzel, Jushua Waltman N, PA-C 10/08/2016 10:25 AM

## 2016-10-09 LAB — CERVICOVAGINAL ANCILLARY ONLY
BACTERIAL VAGINITIS: POSITIVE — AB
Candida vaginitis: NEGATIVE

## 2016-10-09 LAB — BETA HCG QUANT (REF LAB): HCG QUANT: 6 m[IU]/mL

## 2016-10-11 ENCOUNTER — Other Ambulatory Visit: Payer: Self-pay | Admitting: Medical

## 2016-10-11 DIAGNOSIS — N760 Acute vaginitis: Principal | ICD-10-CM

## 2016-10-11 DIAGNOSIS — B9689 Other specified bacterial agents as the cause of diseases classified elsewhere: Secondary | ICD-10-CM

## 2016-10-11 MED ORDER — METRONIDAZOLE 500 MG PO TABS: 500.0000 mg | ORAL_TABLET | Freq: Two times a day (BID) | ORAL | 0 refills | Status: DC

## 2016-10-16 ENCOUNTER — Telehealth: Payer: Self-pay | Admitting: General Practice

## 2016-10-16 ENCOUNTER — Encounter: Payer: Self-pay | Admitting: General Practice

## 2016-10-16 NOTE — Telephone Encounter (Addendum)
Called patient, no answer- left message stating we were trying to reach you concerning results. Sorry to have missed you- we will send you a mychart message with the information.  ----- Message from Marny LowensteinJulie N Wenzel, PA-C sent at 10/11/2016  1:05 PM EDT ----- Please inform patient of +BV and Rx for Flagyl sent to pharmacy. Also, needs repeat hCG in 1 week, still very slightly elevated.   Thanks,  Raynelle FanningJulie

## 2016-10-17 ENCOUNTER — Other Ambulatory Visit: Payer: Self-pay

## 2016-10-17 DIAGNOSIS — O039 Complete or unspecified spontaneous abortion without complication: Secondary | ICD-10-CM

## 2016-10-18 LAB — BETA HCG QUANT (REF LAB): HCG QUANT: 2 m[IU]/mL

## 2016-10-21 ENCOUNTER — Telehealth: Payer: Self-pay | Admitting: *Deleted

## 2016-10-21 NOTE — Telephone Encounter (Signed)
-----   Message from Marny LowensteinJulie N Wenzel, PA-C sent at 10/18/2016 12:54 PM EDT ----- HCG is <5. Please inform patient of normal (non-pregnant) hCG levels. No additional labs are needed.   Thanks,  Raynelle FanningJulie

## 2016-10-21 NOTE — Telephone Encounter (Signed)
I called Rolly SalterHaley and gave her the results of bhcg and reccomendations per [place],CNM. Kierrah voices understanding.

## 2017-08-19 ENCOUNTER — Emergency Department (HOSPITAL_COMMUNITY)
Admission: EM | Admit: 2017-08-19 | Discharge: 2017-08-19 | Disposition: A | Discharge status: Home / Self Care | Payer: Self-pay | Attending: Emergency Medicine | Admitting: Emergency Medicine

## 2017-08-19 ENCOUNTER — Encounter (HOSPITAL_COMMUNITY): Payer: Self-pay

## 2017-08-19 ENCOUNTER — Other Ambulatory Visit: Payer: Self-pay

## 2017-08-19 DIAGNOSIS — Z79899 Other long term (current) drug therapy: Secondary | ICD-10-CM | POA: Insufficient documentation

## 2017-08-19 DIAGNOSIS — H109 Unspecified conjunctivitis: Secondary | ICD-10-CM | POA: Insufficient documentation

## 2017-08-19 MED ORDER — NAPHAZOLINE HCL 0.1 % OP SOLN: 1.0000 [drp] | Freq: Four times a day (QID) | OPHTHALMIC | 0 refills | Status: DC | PRN

## 2017-08-19 MED ORDER — POLYMYXIN B-TRIMETHOPRIM 10000-0.1 UNIT/ML-% OP SOLN: 1.0000 [drp] | OPHTHALMIC | 0 refills | Status: DC

## 2017-08-19 NOTE — ED Notes (Signed)
Patient denies wearing contact lenses

## 2017-08-19 NOTE — ED Notes (Signed)
Patient reports :"Thursday night, both eyes were "puffy", Friday morning right was red and itching, over the weekend, both eyes 'matted' in the mornings and this morning left eye is now red and itching. Also reports, "I feel maybe this lymph node is swollen" as patient palpates left side. Patient reports taking "[employer]" brand of allergy medine

## 2017-08-19 NOTE — Discharge Instructions (Signed)
Please use antibiotic drops for the next week to ensure any bacterial conjunctivitis is treated.  Symptoms are likely due to allergic conjunctivitis please begin taking Zyrtec once daily you can get this over-the-counter.  You can also use Naphcon drops to help ease eye irritation.  [facility].  Return to the emergency department for worsening eye redness and swelling, eye pain, fevers, chills or other new or concerning symptoms.

## 2017-08-19 NOTE — ED Provider Notes (Signed)
[facility] EMERGENCY DEPARTMENT Provider Note   CSN: [id number] Arrival date & time: 08/19/17  0934     History   Chief Complaint Chief Complaint  Patient presents with  . Conjunctivitis    HPI Gina Lucas is a 22 y.o. female.  Gina Lucas is a 22 y.o. Female who is otherwise healthy, presents to the ED for evaluation of bilateral eye redness and irritation.  She reports Thursday night both eyes were kind of puffy and Friday morning her right eye was red and itching with some drainage matting eyelashes.  Over the weekend both eyes were matted shut in the mornings and her left eye started to become puffy red and itchy as well.  Patient reports that her eyes feel scratchy and irritated but she denies eye pain, no change in vision.  Does report her allergies have been flaring up right now she has also had some nasal congestion.  She tried an over-the-counter allergy medicine from [employer] without improvement.  Patient has not tried anything else to treat her symptoms.  She does report some thick drainage from her eyes, denies swelling or pain surrounding the eyes, no pain in the ears.  Does report she thinks she has some swollen lymph nodes on the left side of her neck.  Patient does not wear contacts or glasses.  Denies any foreign bodies in the eye or foreign body sensation.     Past Medical History:  Diagnosis Date  . Medical history non-contributory     Patient Active Problem List   Diagnosis Date Noted  . Grief associated with loss of fetus 10/08/2016    Past Surgical History:  Procedure Laterality Date  . BUNIONECTOMY Right      OB History    Gravida  1   Para      Term      Preterm      AB      Living        SAB      TAB      Ectopic      Multiple      Live Births               Home Medications    Prior to Admission medications   Medication Sig Start Date End Date Taking? Authorizing Provider    acetaminophen-codeine (TYLENOL #3) 300-30 MG tablet Take 1-2 tablets by mouth every 6 (six) hours as needed for moderate pain. Patient not taking: Reported on 10/08/2016 09/20/16   Donette Larry, CNM  ibuprofen (ADVIL,MOTRIN) 600 MG tablet Take 1 tablet (600 mg total) by mouth every 6 (six) hours as needed for cramping. Patient not taking: Reported on 10/08/2016 09/20/16   Donette Larry, CNM  metroNIDAZOLE (FLAGYL) 500 MG tablet Take 1 tablet (500 mg total) by mouth 2 (two) times daily. 10/11/16   Marny Lowenstein, PA-C  naphazoline (NAPHCON) 0.1 % ophthalmic solution Place 1 drop into both eyes 4 (four) times daily as needed for eye irritation. 08/19/17   Dartha Lodge, PA-C  Prenatal Vit-Fe Fumarate-FA (PRENATAL MULTIVITAMIN) TABS tablet Take 1 tablet by mouth daily at 12 noon.    [provider]  promethazine (PHENERGAN) 25 MG tablet Take 1 tablet (25 mg total) by mouth every 6 (six) hours as needed for nausea or vomiting. Patient not taking: Reported on 10/08/2016 09/20/16   Donette Larry, CNM  trimethoprim-polymyxin b (POLYTRIM) ophthalmic solution Place 1 drop into the right eye every 4 (  four) hours. 08/19/17   Dartha LodgeFord, Dhanya Bogle N, PA-C    Family History History reviewed. No pertinent family history.  Social History Social History   Tobacco Use  . Smoking status: Never Smoker  . Smokeless tobacco: Never Used  Substance Use Topics  . Alcohol use: Yes    Comment: ocasionally  . Drug use: No     Allergies   Other   Review of Systems Review of Systems  Constitutional: Negative for chills and fever.  HENT: Positive for congestion and rhinorrhea. Negative for ear discharge, ear pain and sore throat.   Eyes: Positive for discharge, redness and itching. Negative for photophobia, pain and visual disturbance.  Respiratory: Negative for cough.   Musculoskeletal: Negative for neck pain and neck stiffness.  Skin: Negative for color change and rash.  Neurological: Negative for  headaches.     Physical Exam Updated Vital Signs BP 118/76 (BP Location: Right Arm)   Pulse 78   Temp 97.9 F (36.6 C) (Oral)   Resp 17   LMP 08/02/2017 (Within Days)   SpO2 97%   Physical Exam  Constitutional: She appears well-developed and well-nourished. No distress.  HENT:  Head: Normocephalic and atraumatic.  Eyes: Pupils are equal, round, and reactive to light. EOM are normal. Right eye exhibits discharge. Left eye exhibits discharge.  Bilateral eyes with injected sclera and erythematous conjunctivae, clear drainage present, no periorbital swelling, no proptosis, PERRLA and EOMs intact, no pain with extraocular movements, no consensual pain, no evidence of corneal abrasion or ulceration bilaterally  Neck: Neck supple.  Intimal if any left-sided lymphadenopathy noted, neck supple with normal range of motion  Pulmonary/Chest: Effort normal. No respiratory distress.  Neurological: She is alert. Coordination normal.  Skin: Skin is warm and dry. Capillary refill takes less than 2 seconds. She is not diaphoretic.  Psychiatric: She has a normal mood and affect. Her behavior is normal.  Nursing note and vitals reviewed.    ED Treatments / Results  Labs (all labs ordered are listed, but only abnormal results are displayed) Labs Reviewed - No data to display  EKG None  Radiology No results found.  Procedures Procedures (including critical care time)  Medications Ordered in ED Medications - No data to display   Initial Impression / Assessment and Plan / ED Course  I have reviewed the triage vital signs and the nursing notes.  Pertinent labs & imaging results that were available during my care of the patient were reviewed by me and considered in my medical decision making (see chart for details).  Patient presentation consistent with conjunctivitis.  No purulent discharge today although patient does report her eyes have been matted shut in the mornings.  No evidence of  corneal abrasions, entrapment, consensual photophobia.  Presentation non-concerning for iritis, corneal abrasions, or HSV.  Will prescribe Polytrim eyedrops to rule out any bacterial conjunctivitis have also prescribed naphazolin drops for itching.  Personal hygiene and frequent handwashing discussed.  Patient advised to followup with PCP and opthalmology if symptoms persist or worsen in any way including vision change or purulent discharge.  Patient verbalizes understanding and is agreeable with discharge.   Final Clinical Impressions(s) / ED Diagnoses   Final diagnoses:  Conjunctivitis of both eyes, unspecified conjunctivitis type    ED Discharge Orders        Ordered    trimethoprim-polymyxin b (POLYTRIM) ophthalmic solution  Every 4 hours     08/19/17 1034    naphazoline (NAPHCON) 0.1 % ophthalmic solution  4 times daily PRN     08/19/17 1034       Dartha Lodge, [place] 08/19/17 1103    Rolan Bucco, MD 08/19/17 1414

## 2017-08-19 NOTE — ED Notes (Signed)
D/c reviewed with patient with teach back, no further questions at this time

## 2017-08-19 NOTE — ED Triage Notes (Signed)
Pt reports she has had right eye itching that started last Thursday. She reports now has itching in left eye. Pt also reports some tenderness in the left side of her neck in her lymph nodes. No swelling noted.

## 2018-05-21 IMAGING — US US OB TRANSVAGINAL
1 series · 15 of 28 positions shown · non-contrast
Comparison: 09/04/2016

CLINICAL DATA: Early pregnancy, bleeding

EXAM:
TRANSVAGINAL OB ULTRASOUND
TECHNIQUE: Transvaginal ultrasound was performed for complete evaluation of the
gestation as well as the maternal uterus, adnexal regions, and
pelvic cul-de-sac.

[Series 1: us ob transvaginal · 41 acquisitions, 15 frames shown]
[im 1/41]
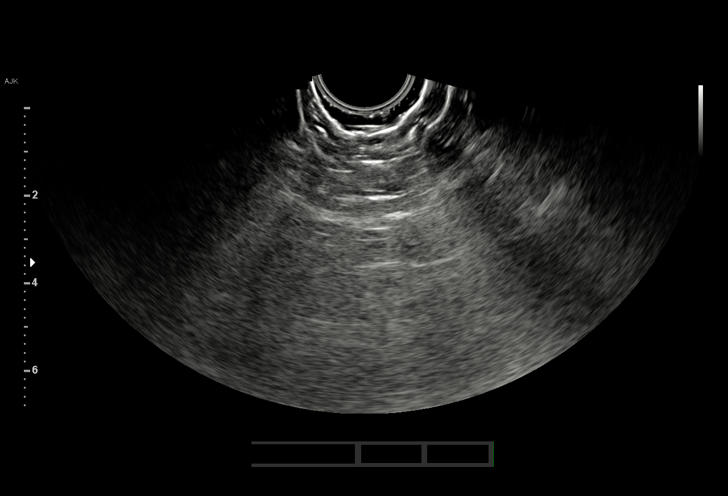
[im 3/41]
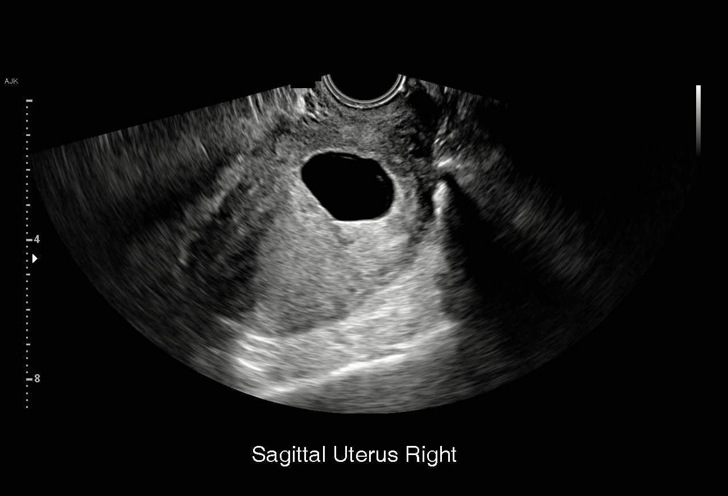
[im 6/41]
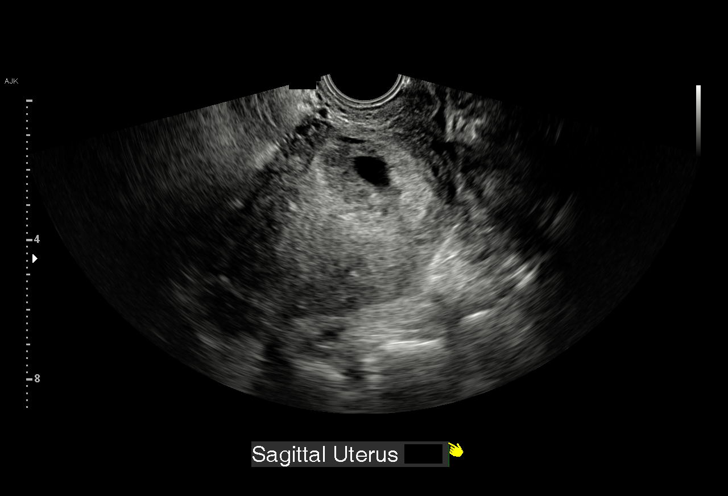
[im 9/41]
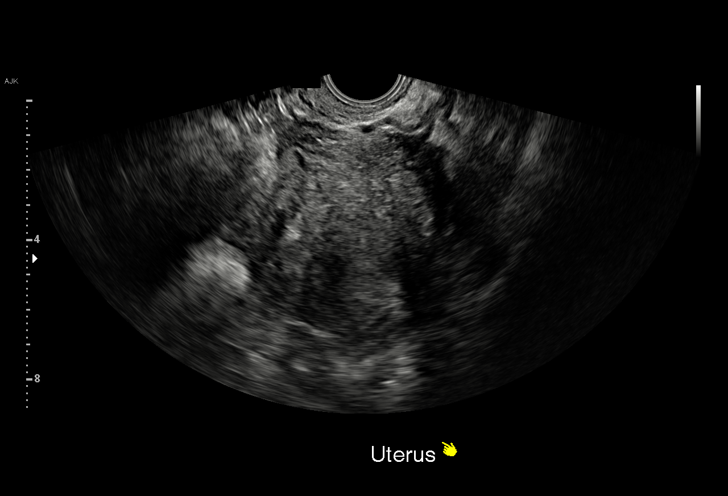
[im 12/41]
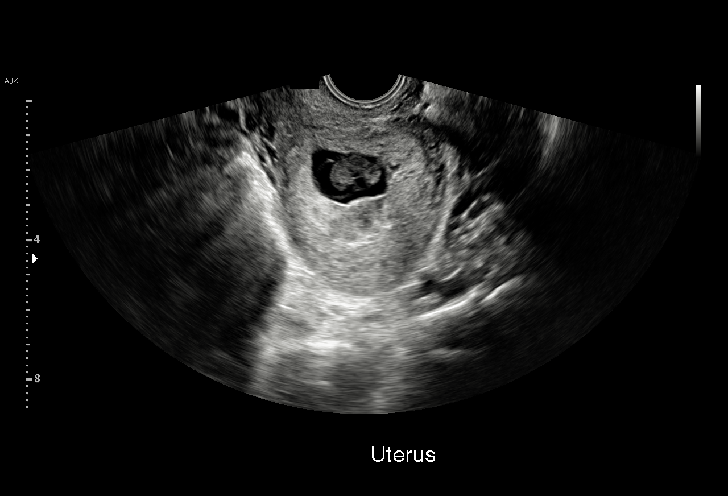
[im 15/41]
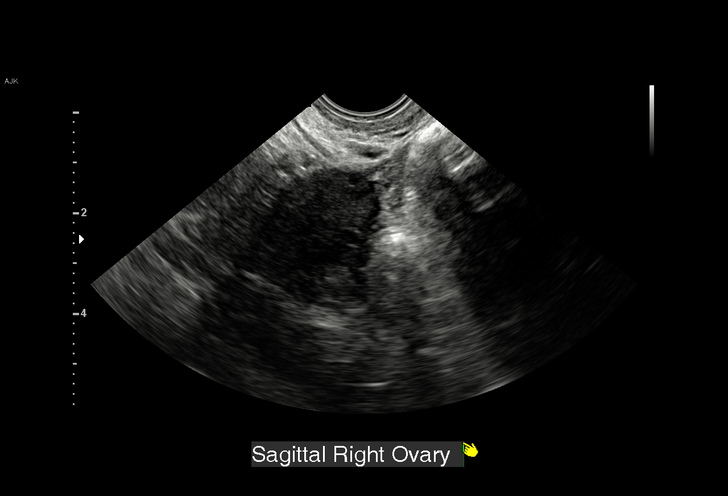
[im 18/41]
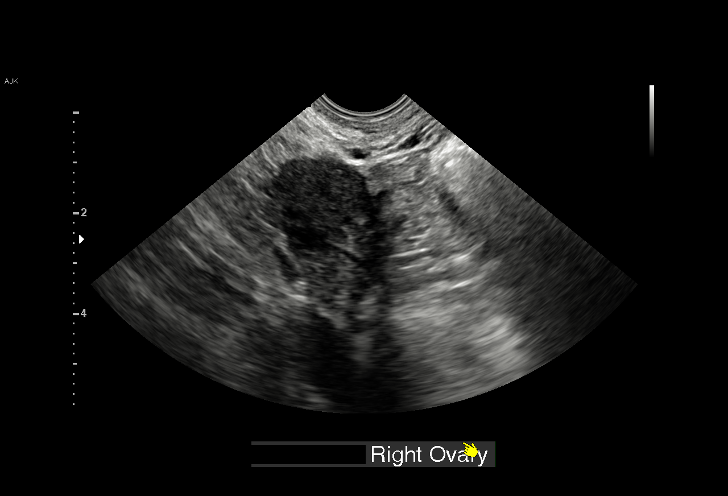
[im 21/41]
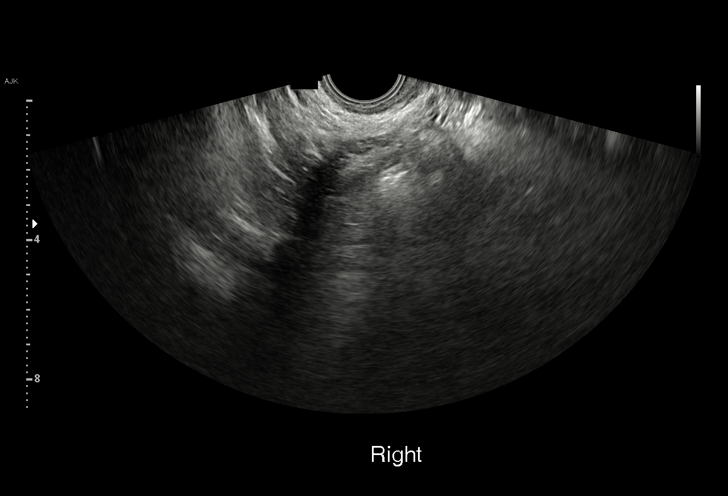
[im 23/41]
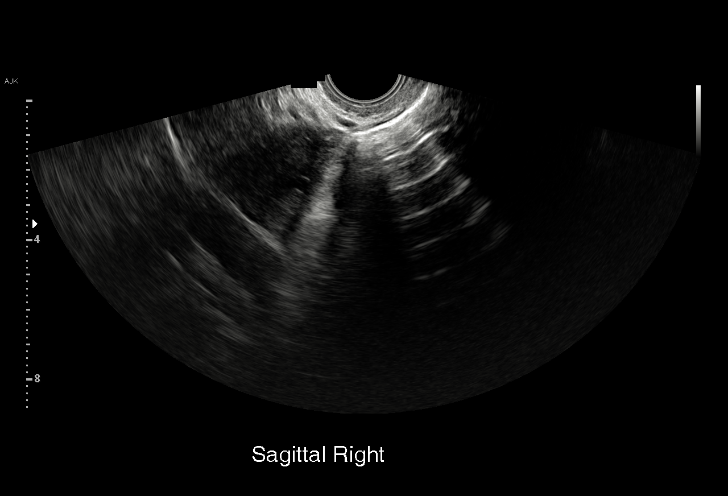
[im 26/41]
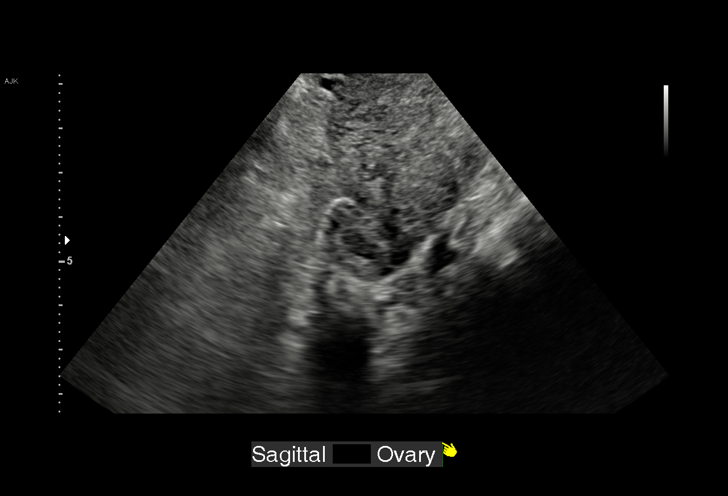
[im 29/41]
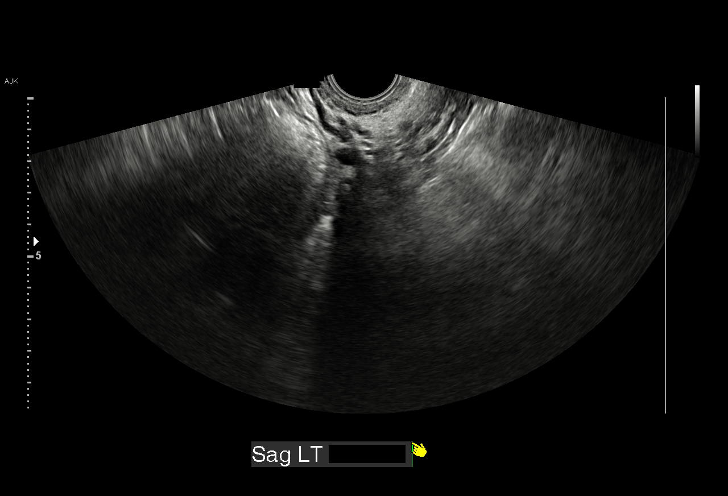
[im 32/41]
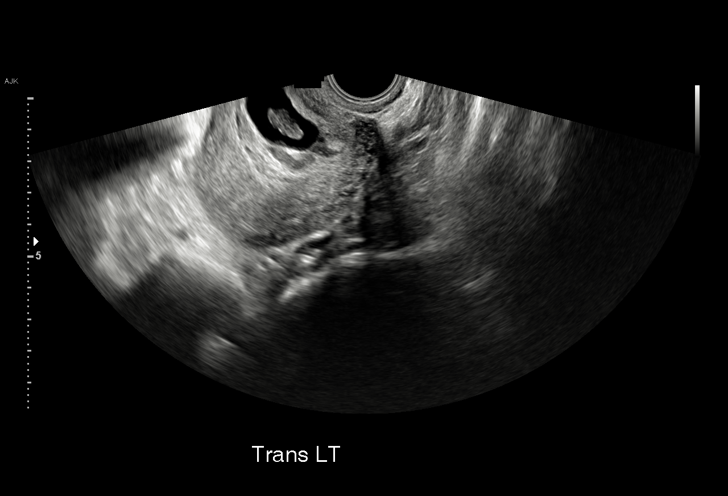
[im 35/41]
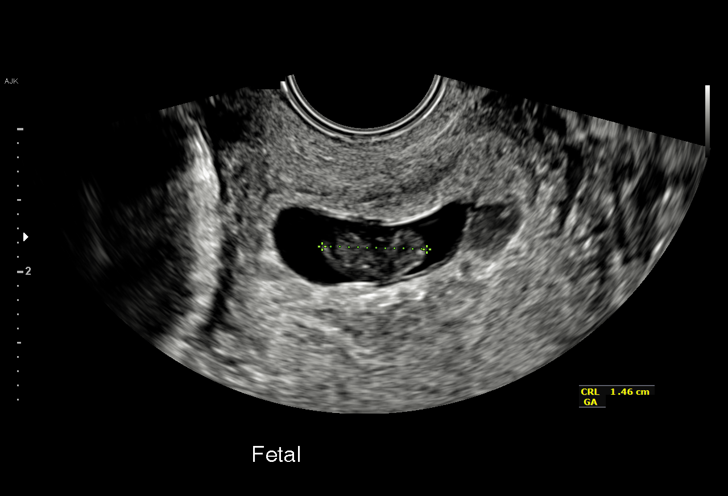
[im 38/41]
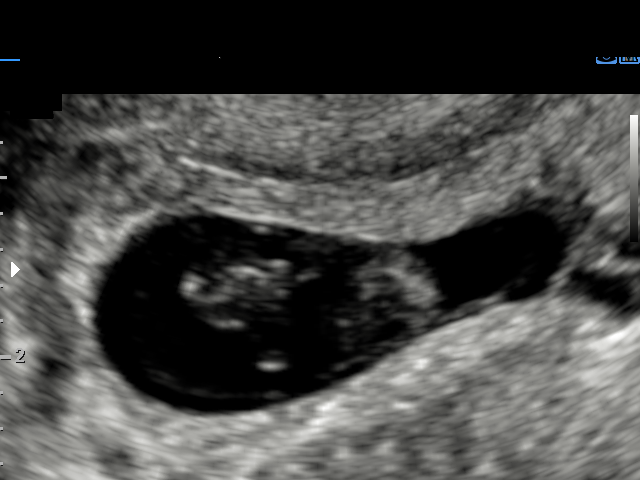
[im 41/41]
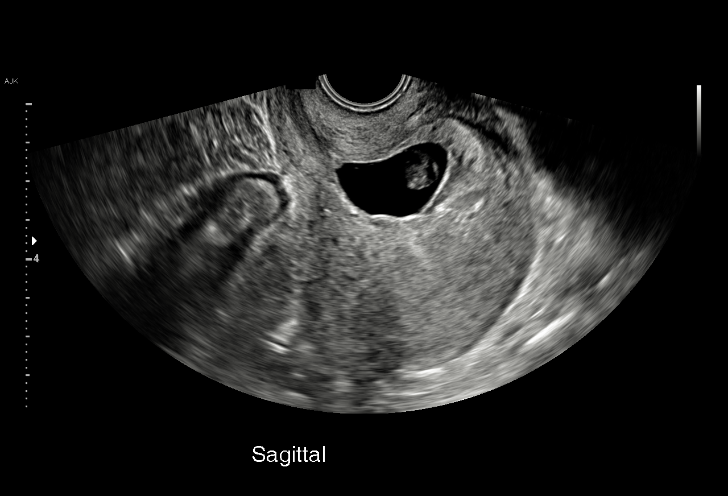

[15 of 28 positions shown; findings below may reference images not displayed]

FINDINGS: Intrauterine gestational sac: Single

Yolk sac:  Not visualized

Embryo:  Visualized

Cardiac Activity: Not visualized

Heart Rate:  bpm

MSD:   mm    w     d

CRL:   15  mm   7 w 5 d                  US EDC: 05/04/2017

Subchorionic hemorrhage:  None visualized.

Maternal uterus/adnexae: No adnexal mass.  Trace free fluid.
IMPRESSION: No progression of the intrauterine gestation since prior study 2-3
weeks ago. No detectable cardiac activity. Findings meet definitive
criteria for failed pregnancy. This follows SRU consensus
guidelines: Diagnostic Criteria for Nonviable Pregnancy Early in the
First Trimester. N Engl J Med 2345;[DATE].

## 2018-09-22 ENCOUNTER — Encounter (HOSPITAL_COMMUNITY): Payer: Self-pay | Admitting: Emergency Medicine

## 2018-09-22 ENCOUNTER — Emergency Department (HOSPITAL_COMMUNITY)
Admission: EM | Admit: 2018-09-22 | Discharge: 2018-09-22 | Disposition: A | Discharge status: Home / Self Care | Payer: Self-pay | Attending: Emergency Medicine | Admitting: Emergency Medicine

## 2018-09-22 ENCOUNTER — Other Ambulatory Visit: Payer: Self-pay

## 2018-09-22 DIAGNOSIS — R110 Nausea: Secondary | ICD-10-CM | POA: Insufficient documentation

## 2018-09-22 DIAGNOSIS — R51 Headache: Secondary | ICD-10-CM | POA: Insufficient documentation

## 2018-09-22 DIAGNOSIS — R519 Headache, unspecified: Secondary | ICD-10-CM

## 2018-09-22 LAB — URINALYSIS, ROUTINE W REFLEX MICROSCOPIC
Bilirubin Urine: NEGATIVE
Glucose, UA: NEGATIVE mg/dL
Hgb urine dipstick: NEGATIVE
Ketones, ur: NEGATIVE mg/dL
Leukocytes,Ua: NEGATIVE
Nitrite: NEGATIVE
Protein, ur: NEGATIVE mg/dL
Specific Gravity, Urine: 1.019 (ref 1.005–1.030)
pH: 7 (ref 5.0–8.0)

## 2018-09-22 LAB — CBC
HCT: 41.9 % (ref 36.0–46.0)
Hemoglobin: 13.8 g/dL (ref 12.0–15.0)
MCH: 30 pg (ref 26.0–34.0)
MCHC: 32.9 g/dL (ref 30.0–36.0)
MCV: 91.1 fL (ref 80.0–100.0)
Platelets: 232 10*3/uL (ref 150–400)
RBC: 4.6 MIL/uL (ref 3.87–5.11)
RDW: 12.5 % (ref 11.5–15.5)
WBC: 10.6 10*3/uL — ABNORMAL HIGH (ref 4.0–10.5)
nRBC: 0 % (ref 0.0–0.2)

## 2018-09-22 LAB — COMPREHENSIVE METABOLIC PANEL
ALT: 16 U/L (ref 0–44)
AST: 16 U/L (ref 15–41)
Albumin: 4 g/dL (ref 3.5–5.0)
Alkaline Phosphatase: 43 U/L (ref 38–126)
Anion gap: 5 (ref 5–15)
BUN: 8 mg/dL (ref 6–20)
CO2: 25 mmol/L (ref 22–32)
Calcium: 8.9 mg/dL (ref 8.9–10.3)
Chloride: 110 mmol/L (ref 98–111)
Creatinine, Ser: 0.66 mg/dL (ref 0.44–1.00)
GFR calc Af Amer: >60 mL/min (ref >60)
GFR calc non Af Amer: >60 mL/min (ref >60)
Glucose, Bld: 119 mg/dL — ABNORMAL HIGH (ref 70–99)
Potassium: 3.5 mmol/L (ref 3.5–5.1)
Sodium: 140 mmol/L (ref 135–145)
Total Bilirubin: 1.3 mg/dL — ABNORMAL HIGH (ref 0.3–1.2)
Total Protein: 6.5 g/dL (ref 6.5–8.1)

## 2018-09-22 LAB — I-STAT BETA HCG BLOOD, ED (MC, WL, AP ONLY): I-stat hCG, quantitative: <5 m[IU]/mL (ref <5)

## 2018-09-22 LAB — LIPASE, BLOOD: Lipase: 27 U/L (ref 11–51)

## 2018-09-22 MED ORDER — SODIUM CHLORIDE 0.9% FLUSH
3.0000 mL | Freq: Once | INTRAVENOUS | Status: AC
  Administered 2018-09-22: 3 mL via INTRAVENOUS

## 2018-09-22 MED ORDER — SODIUM CHLORIDE 0.9 % IV BOLUS
1000.0000 mL | Freq: Once | INTRAVENOUS | Status: AC
  Administered 2018-09-22: 1000 mL via INTRAVENOUS

## 2018-09-22 MED ORDER — ONDANSETRON 4 MG PO TBDP: ORAL_TABLET | ORAL | 0 refills | Status: DC

## 2018-09-22 MED ORDER — PROCHLORPERAZINE EDISYLATE 10 MG/2ML IJ SOLN
10.0000 mg | Freq: Once | INTRAMUSCULAR | Status: AC
  Administered 2018-09-22: 10 mg via INTRAVENOUS
  Filled 2018-09-22: qty 2

## 2018-09-22 MED ORDER — DIPHENHYDRAMINE HCL 50 MG/ML IJ SOLN
25.0000 mg | Freq: Once | INTRAMUSCULAR | Status: AC
  Administered 2018-09-22: 25 mg via INTRAVENOUS
  Filled 2018-09-22: qty 1

## 2018-09-22 NOTE — ED Triage Notes (Signed)
Patient states she has been feeling weak, a headache, nauseas, and tired for a week. Patient states she look up her symptoms two days ago and it said dehydration. She has been drinking pedialyte for two days. Patient states she does not feel any better.

## 2018-09-22 NOTE — ED Provider Notes (Signed)
[facility]-EMERGENCY DEPT Provider Note   CSN: [id number] Arrival date & time: 09/22/18  2018    History   Chief Complaint Chief Complaint  Patient presents with  . Emesis  . Headache    HPI Gina Lucas is a 23 y.o. female.     Patient reporting persistent headache, nausea without emesis, and feeling tired for a week. She also reports frequent urination. Soft stools, no diarrhea. Occasional cramp-like abdominal discomfort, non-localized. She is trying to get pregnant. Family history of anemia and diabetes.  The history is provided by the patient. No language interpreter was used.  Headache  Pain location:  Generalized Quality:  Dull Radiates to:  Does not radiate Onset quality:  Gradual Duration:  1 week Timing:  Intermittent Chronicity:  New Similar to prior headaches: no   Associated symptoms: nausea   Associated symptoms: no diarrhea, no focal weakness, no loss of balance, no myalgias and no vomiting     Past Medical History:  Diagnosis Date  . Medical history non-contributory     Patient Active Problem List   Diagnosis Date Noted  . Grief associated with loss of fetus 10/08/2016    Past Surgical History:  Procedure Laterality Date  . BUNIONECTOMY Right      OB History    Gravida  1   Para      Term      Preterm      AB      Living        SAB      TAB      Ectopic      Multiple      Live Births               Home Medications    Prior to Admission medications   Medication Sig Start Date End Date Taking? Authorizing Provider  acetaminophen-codeine (TYLENOL #3) 300-30 MG tablet Take 1-2 tablets by mouth every 6 (six) hours as needed for moderate pain. Patient not taking: Reported on 10/08/2016 09/20/16   Donette Larry, CNM  ibuprofen (ADVIL,MOTRIN) 600 MG tablet Take 1 tablet (600 mg total) by mouth every 6 (six) hours as needed for cramping. Patient not taking: Reported on 10/08/2016 09/20/16   Donette Larry, CNM  metroNIDAZOLE (FLAGYL) 500 MG tablet Take 1 tablet (500 mg total) by mouth 2 (two) times daily. 10/11/16   Marny Lowenstein, PA-C  naphazoline (NAPHCON) 0.1 % ophthalmic solution Place 1 drop into both eyes 4 (four) times daily as needed for eye irritation. 08/19/17   Dartha Lodge, PA-C  Prenatal Vit-Fe Fumarate-FA (PRENATAL MULTIVITAMIN) TABS tablet Take 1 tablet by mouth daily at 12 noon.    [provider]  promethazine (PHENERGAN) 25 MG tablet Take 1 tablet (25 mg total) by mouth every 6 (six) hours as needed for nausea or vomiting. Patient not taking: Reported on 10/08/2016 09/20/16   Donette Larry, CNM  trimethoprim-polymyxin b (POLYTRIM) ophthalmic solution Place 1 drop into the right eye every 4 (four) hours. 08/19/17   Dartha Lodge, PA-C    Family History History reviewed. No pertinent family history.  Social History Social History   Tobacco Use  . Smoking status: Never Smoker  . Smokeless tobacco: Never Used  Substance Use Topics  . Alcohol use: Yes    Comment: ocasionally  . Drug use: No     Allergies   Other   Review of Systems Review of Systems  Cardiovascular: Negative for chest pain.  Gastrointestinal: Positive for nausea. Negative for diarrhea and vomiting.  Endocrine: Positive for polyuria.  Musculoskeletal: Negative for myalgias.  Skin: Negative for rash.  Neurological: Positive for headaches. Negative for focal weakness and loss of balance.  All other systems reviewed and are negative.    Physical Exam Updated Vital Signs BP 120/70 (BP Location: Left Arm)   Pulse 78   Temp 98.8 F (37.1 C) (Oral)   Resp 15   Ht 5\' 7"  (1.702 m)   Wt 113.4 kg   LMP 09/01/2018   SpO2 99%   BMI 39.16 kg/m   Physical Exam Vitals signs and nursing note reviewed.  Constitutional:      Appearance: She is well-developed. She is not ill-appearing.  HENT:     Head: Normocephalic.  Eyes:     Conjunctiva/sclera: Conjunctivae normal.   Cardiovascular:     Rate and Rhythm: Normal rate and regular rhythm.  Pulmonary:     Effort: Pulmonary effort is normal.     Breath sounds: Normal breath sounds.  Abdominal:     General: There is no distension.     Palpations: Abdomen is soft.  Musculoskeletal: Normal range of motion.  Skin:    General: Skin is warm and dry.  Neurological:     Mental Status: She is alert and oriented to person, place, and time.  Psychiatric:        Mood and Affect: Mood normal.      ED Treatments / Results  Labs (all labs ordered are listed, but only abnormal results are displayed) Labs Reviewed  COMPREHENSIVE METABOLIC PANEL - Abnormal; Notable for the following components:      Result Value   Glucose, Bld 119 (*)    Total Bilirubin 1.3 (*)    All other components within normal limits  CBC - Abnormal; Notable for the following components:   WBC 10.6 (*)    All other components within normal limits  LIPASE, BLOOD  URINALYSIS, ROUTINE W REFLEX MICROSCOPIC  I-STAT BETA HCG BLOOD, ED ([facility], [facility], [facility] ONLY)    EKG None  Radiology No results found.  Procedures Procedures (including critical care time)  Medications Ordered in ED Medications  sodium chloride flush (NS) 0.9 % injection 3 mL (has no administration in time range)     Initial Impression / Assessment and Plan / ED Course  I have reviewed the triage vital signs and the nursing notes.  Pertinent labs & imaging results that were available during my care of the patient were reviewed by me and considered in my medical decision making (see chart for details).       Labs reviewed and shared with patient.  Pt HA treated and improved while in ED.  Presentation is not concerning for [facility], ICH, Meningitis, or temporal arteritis. Pt is afebrile with no focal neuro deficits, nuchal rigidity, or change in vision. Pt is to follow up with PCP to discuss prophylactic medication. Pt verbalizes understanding and is agreeable with plan to dc.    Final Clinical Impressions(s) / ED Diagnoses   Final diagnoses:  Bad headache    ED Discharge Orders         Ordered    ondansetron (ZOFRAN ODT) 4 MG disintegrating tablet     09/22/18 2339           Felicie MornSmith, Baeleigh Devincent, NP 09/22/18 [id number]    Margarita Grizzleay, Danielle, MD 09/23/18 1220

## 2018-12-02 ENCOUNTER — Encounter (HOSPITAL_COMMUNITY): Payer: Self-pay | Admitting: *Deleted

## 2018-12-02 ENCOUNTER — Inpatient Hospital Stay (HOSPITAL_COMMUNITY)
Admission: AD | Admit: 2018-12-02 | Discharge: 2018-12-02 | Disposition: A | Discharge status: Home / Self Care | Payer: Self-pay | Attending: Obstetrics and Gynecology | Admitting: Obstetrics and Gynecology

## 2018-12-02 ENCOUNTER — Other Ambulatory Visit: Payer: Self-pay

## 2018-12-02 ENCOUNTER — Inpatient Hospital Stay (HOSPITAL_COMMUNITY): Payer: Self-pay

## 2018-12-02 DIAGNOSIS — B9689 Other specified bacterial agents as the cause of diseases classified elsewhere: Secondary | ICD-10-CM

## 2018-12-02 DIAGNOSIS — O26891 Other specified pregnancy related conditions, first trimester: Secondary | ICD-10-CM

## 2018-12-02 DIAGNOSIS — O23591 Infection of other part of genital tract in pregnancy, first trimester: Secondary | ICD-10-CM

## 2018-12-02 DIAGNOSIS — N760 Acute vaginitis: Secondary | ICD-10-CM

## 2018-12-02 DIAGNOSIS — O3680X0 Pregnancy with inconclusive fetal viability, not applicable or unspecified: Secondary | ICD-10-CM

## 2018-12-02 DIAGNOSIS — R109 Unspecified abdominal pain: Secondary | ICD-10-CM

## 2018-12-02 DIAGNOSIS — R1031 Right lower quadrant pain: Secondary | ICD-10-CM | POA: Insufficient documentation

## 2018-12-02 DIAGNOSIS — Z3A01 Less than 8 weeks gestation of pregnancy: Secondary | ICD-10-CM

## 2018-12-02 LAB — WET PREP, GENITAL
Sperm: NONE SEEN
Trich, Wet Prep: NONE SEEN
Yeast Wet Prep HPF POC: NONE SEEN

## 2018-12-02 LAB — CBC
HCT: 38.6 % (ref 36.0–46.0)
Hemoglobin: 13.2 g/dL (ref 12.0–15.0)
MCH: 30.1 pg (ref 26.0–34.0)
MCHC: 34.2 g/dL (ref 30.0–36.0)
MCV: 87.9 fL (ref 80.0–100.0)
Platelets: 226 10*3/uL (ref 150–400)
RBC: 4.39 MIL/uL (ref 3.87–5.11)
RDW: 12.2 % (ref 11.5–15.5)
WBC: 10.3 10*3/uL (ref 4.0–10.5)
nRBC: 0 % (ref 0.0–0.2)

## 2018-12-02 LAB — URINALYSIS, ROUTINE W REFLEX MICROSCOPIC
Bilirubin Urine: NEGATIVE
Glucose, UA: NEGATIVE mg/dL
Hgb urine dipstick: NEGATIVE
Ketones, ur: NEGATIVE mg/dL
Nitrite: NEGATIVE
Protein, ur: NEGATIVE mg/dL
Specific Gravity, Urine: 1.026 (ref 1.005–1.030)
pH: 6 (ref 5.0–8.0)

## 2018-12-02 LAB — POCT PREGNANCY, URINE: Preg Test, Ur: POSITIVE — AB

## 2018-12-02 LAB — HCG, QUANTITATIVE, PREGNANCY: hCG, Beta Chain, Quant, S: 321 m[IU]/mL — ABNORMAL HIGH (ref <5)

## 2018-12-02 MED ORDER — METRONIDAZOLE 500 MG PO TABS: 500.0000 mg | ORAL_TABLET | Freq: Two times a day (BID) | ORAL | 0 refills | Status: DC

## 2018-12-02 NOTE — MAU Provider Note (Signed)
Chief Complaint: Abdominal Pain   First Provider Initiated Contact with Patient 12/02/18 2008     SUBJECTIVE HPI: Gina Lucas is a 23 y.o. G2P0010 at 2455w0d who presents to Maternity Admissions reporting abdominal pain. Symptoms started 3 days ago. Reports intermittent pain in RLQ that radiates to left side. Has positive HPT yesterday. Denies n/v/d, dysuria, fever/chills, or vaginal bleeding. Has had an increase in vaginal discharge. Thin & white discharge. No odor. Denies itching or irritation.  Last BM was yesterday but states she feels like she is constipated because she normally goes a few times per day.   Location: abdomen Quality: cramping Severity: 2/10 on pain scale Duration: 3 days Timing: intermittent Modifying factors: none Associated signs and symptoms: vaginal discharge & constipation  Past Medical History:  Diagnosis Date  . Medical history non-contributory    OB History  Gravida Para Term Preterm AB Living  2       1    SAB TAB Ectopic Multiple Live Births  1            # Outcome Date GA Lbr Len/2nd Weight Sex Delivery Anes PTL Lv  2 Current           1 SAB 09/2016           Past Surgical History:  Procedure Laterality Date  . BUNIONECTOMY Right    Social History   Socioeconomic History  . Marital status: Single    Spouse name: Not on file  . Number of children: Not on file  . Years of education: Not on file  . Highest education level: Not on file  Occupational History  . Not on file  Social Needs  . Financial resource strain: Not on file  . Food insecurity    Worry: Not on file    Inability: Not on file  . Transportation needs    Medical: Not on file    Non-medical: Not on file  Tobacco Use  . Smoking status: Never Smoker  . Smokeless tobacco: Never Used  Substance and Sexual Activity  . Alcohol use: Yes    Comment: ocasionally  . Drug use: No  . Sexual activity: Not Currently    Birth control/protection: None    Comment: Patient wants to  get pregnant again  Lifestyle  . Physical activity    Days per week: Not on file    Minutes per session: Not on file  . Stress: Not on file  Relationships  . Social Musicianconnections    Talks on phone: Not on file    Gets together: Not on file    Attends religious service: Not on file    Active member of club or organization: Not on file    Attends meetings of clubs or organizations: Not on file    Relationship status: Not on file  . Intimate partner violence    Fear of current or ex partner: Not on file    Emotionally abused: Not on file    Physically abused: Not on file    Forced sexual activity: Not on file  Other Topics Concern  . Not on file  Social History Narrative  . Not on file   Family History  Problem Relation Age of Onset  . Diabetes Mother   . Hypertension Mother   . Bipolar disorder Mother   . Heart disease Mother   . Hypertension Father    No current facility-administered medications on file prior to encounter.    Current Outpatient  Medications on File Prior to Encounter  Medication Sig Dispense Refill  . acetaminophen-codeine (TYLENOL #3) 300-30 MG tablet Take 1-2 tablets by mouth every 6 (six) hours as needed for moderate pain. (Patient not taking: Reported on 10/08/2016) 10 tablet 0  . ibuprofen (ADVIL,MOTRIN) 600 MG tablet Take 1 tablet (600 mg total) by mouth every 6 (six) hours as needed for cramping. (Patient not taking: Reported on 10/08/2016) 30 tablet 0  . metroNIDAZOLE (FLAGYL) 500 MG tablet Take 1 tablet (500 mg total) by mouth 2 (two) times daily. (Patient not taking: Reported on 09/22/2018) 14 tablet 0  . naphazoline (NAPHCON) 0.1 % ophthalmic solution Place 1 drop into both eyes 4 (four) times daily as needed for eye irritation. (Patient not taking: Reported on 09/22/2018) 15 mL 0  . ondansetron (ZOFRAN ODT) 4 MG disintegrating tablet 4mg  ODT q4 hours prn nausea/vomit 4 tablet 0  . promethazine (PHENERGAN) 25 MG tablet Take 1 tablet (25 mg total) by mouth  every 6 (six) hours as needed for nausea or vomiting. (Patient not taking: Reported on 10/08/2016) 30 tablet 0  . trimethoprim-polymyxin b (POLYTRIM) ophthalmic solution Place 1 drop into the right eye every 4 (four) hours. (Patient not taking: Reported on 09/22/2018) 10 mL 0   Allergies  Allergen Reactions  . Other Itching    Blue Cheese    I have reviewed patient's Past Medical Hx, Surgical Hx, Family Hx, Social Hx, medications and allergies.   Review of Systems  Constitutional: Negative.   Gastrointestinal: Positive for abdominal pain and constipation. Negative for diarrhea, nausea and vomiting.  Genitourinary: Positive for vaginal discharge. Negative for dysuria and vaginal bleeding.    OBJECTIVE Patient Vitals for the past 24 hrs:  BP Temp Temp src Pulse Resp Weight  12/02/18 1954 125/73 98.6 F (37 C) Oral 86 17 112 kg   Constitutional: Well-developed, well-nourished female in no acute distress.  Cardiovascular: normal rate & rhythm, no murmur Respiratory: normal rate and effort. Lung sounds clear throughout GI: Abd soft, non-tender, Pos BS x 4. No guarding or rebound tenderness MS: Extremities nontender, no edema, normal ROM Neurologic: Alert and oriented x 4.     LAB RESULTS Results for orders placed or performed during the hospital encounter of 12/02/18 (from the past 24 hour(s))  Urinalysis, Routine w reflex microscopic     Status: Abnormal   Collection Time: 12/02/18  7:36 PM  Result Value Ref Range   Color, Urine YELLOW YELLOW   APPearance HAZY (A) CLEAR   Specific Gravity, Urine 1.026 1.005 - 1.030   pH 6.0 5.0 - 8.0   Glucose, UA NEGATIVE NEGATIVE mg/dL   Hgb urine dipstick NEGATIVE NEGATIVE   Bilirubin Urine NEGATIVE NEGATIVE   Ketones, ur NEGATIVE NEGATIVE mg/dL   Protein, ur NEGATIVE NEGATIVE mg/dL   Nitrite NEGATIVE NEGATIVE   Leukocytes,Ua TRACE (A) NEGATIVE   RBC / HPF 0-5 0 - 5 RBC/hpf   WBC, UA 0-5 0 - 5 WBC/hpf   Bacteria, UA RARE (A) NONE SEEN    Squamous Epithelial / LPF 6-10 0 - 5   Mucus PRESENT   Pregnancy, urine POC     Status: Abnormal   Collection Time: 12/02/18  7:49 PM  Result Value Ref Range   Preg Test, Ur POSITIVE (A) NEGATIVE  Wet prep, genital     Status: Abnormal   Collection Time: 12/02/18  8:26 PM   Specimen: Cervical/Vaginal swab  Result Value Ref Range   Yeast Wet Prep HPF POC NONE SEEN  NONE SEEN   Trich, Wet Prep NONE SEEN NONE SEEN   Clue Cells Wet Prep HPF POC PRESENT (A) NONE SEEN   WBC, Wet Prep HPF POC FEW (A) NONE SEEN   Sperm NONE SEEN   CBC     Status: None   Collection Time: 12/02/18  8:34 PM  Result Value Ref Range   WBC 10.3 4.0 - 10.5 K/uL   RBC 4.39 3.87 - 5.11 MIL/uL   Hemoglobin 13.2 12.0 - 15.0 g/dL   HCT 16.138.6 09.636.0 - 04.546.0 %   MCV 87.9 80.0 - 100.0 fL   MCH 30.1 26.0 - 34.0 pg   MCHC 34.2 30.0 - 36.0 g/dL   RDW 40.912.2 81.111.5 - 91.415.5 %   Platelets 226 150 - 400 K/uL   nRBC 0.0 0.0 - 0.2 %  hCG, quantitative, pregnancy     Status: Abnormal   Collection Time: 12/02/18  8:34 PM  Result Value Ref Range   hCG, Beta Chain, Quant, S 321 (H) <5 mIU/mL    IMAGING [place] Ob Less Than 14 Weeks With Ob Transvaginal  Result Date: 12/02/2018 CLINICAL DATA:  Abdominal cramping, obesity. Estimated gestational age by LMP 5 weeks 0 days. Quantitative beta hCG pending. Positive UPT. G2P0. EXAM: OBSTETRIC <14 WK US AND TRANSVAGINAL OB US TECHNIQUE: Both transabdominal and transvaginal ultrasound examinations were performed for complete evaluation of the gestation as well as the maternal uterus, adnexal regions, and pelvic cul-de-sac. Transvaginal technique was performed to assess early pregnancy. COMPARISON:  Obstetrical sonography Sep 21, 2016, Sep 20, 2016 FINDINGS: Intrauterine gestational sac: None Maternal uterus/adnexae: Retroflexed uterus. Endometrial thickness of approximately 13 mm. No gestational sac is seen. Normal appearance of the myometrium. Normal appearance of the ovaries. Trace amount of simple free  fluid in the pelvis. IMPRESSION: No intrauterine pregnancy visualized. Differential considerations would include early intrauterine pregnancy too early to visualize, spontaneous abortion, or occult ectopic pregnancy. Recommend close clinical followup and serial quantitative beta HCGs and ultrasounds. Electronically Signed   By: Kreg ShropshirePrice  DeHay M.D.   On: 12/02/2018 20:57    MAU COURSE Orders Placed This Encounter  Procedures  . Wet prep, genital  . US OB LESS THAN 14 WEEKS WITH OB TRANSVAGINAL  . Urinalysis, Routine w reflex microscopic  . CBC  . hCG, quantitative, pregnancy  . Pregnancy, urine POC  . Discharge patient   Meds ordered this encounter  Medications  . metroNIDAZOLE (FLAGYL) 500 MG tablet    Sig: Take 1 tablet (500 mg total) by mouth 2 (two) times daily.    Dispense:  14 tablet    Refill:  0    Order Specific Question:   Supervising Provider    Answer:   CONSTANT, PEGGY [[id number]]    MDM +UPT UA, wet prep, GC/chlamydia, CBC, ABO/Rh, quant hCG, and US today to rule out ectopic pregnancy  Ultrasound shows no IUP or signs of ectopic. HCG is 321. Will bring back for serial HCGs  Wet prep + clue cells. Will tx for BV  ASSESSMENT 1. Pregnancy of unknown anatomic location   2. Abdominal pain during pregnancy in first trimester   3. Bacterial vaginosis     PLAN Discharge home in stable condition. SAB vs ectopic precautions Return to MAU on Saturday for repeat HCG GC/CT pending Rx flagyl  Follow-up Information    [facility] Follow up.   Specialty: Obstetrics and Gynecology Why: Return on Saturday for repeat labs or sooner for worsening symptoms Contact information: [address] [id number] [place] [id number] [phone]         Allergies as of 12/02/2018      Reactions   Other Itching   Blue Cheese      Medication List    STOP taking these medications   acetaminophen-codeine 300-30 MG tablet Commonly known  as: TYLENOL #3   ibuprofen 600 MG tablet Commonly known as: ADVIL   naphazoline 0.1 % ophthalmic solution Commonly known as: NAPHCON   ondansetron 4 MG disintegrating tablet Commonly known as: Zofran ODT   promethazine 25 MG tablet Commonly known as: PHENERGAN   trimethoprim-polymyxin b ophthalmic solution Commonly known as: Polytrim     TAKE these medications   metroNIDAZOLE 500 MG tablet Commonly known as: FLAGYL Take 1 tablet (500 mg total) by mouth 2 (two) times daily.        Judeth HornLawrence, Ferron Ishmael, NP 12/02/2018  9:27 PM

## 2018-12-02 NOTE — Discharge Instructions (Signed)
Return to care   If you have heavier bleeding that soaks through more that 2 pads per hour for an hour or more  If you bleed so much that you feel like you might pass out or you do pass out  If you have significant abdominal pain that is not improved with Tylenol      Bacterial Vaginosis  Bacterial vaginosis is a vaginal infection that occurs when the normal balance of bacteria in the vagina is disrupted. It results from an overgrowth of certain bacteria. This is the most common vaginal infection among women ages 49-44. Because bacterial vaginosis increases your risk for STIs (sexually transmitted infections), getting treated can help reduce your risk for chlamydia, gonorrhea, herpes, and HIV (human immunodeficiency virus). Treatment is also important for preventing complications in pregnant women, because this condition can cause an early (premature) delivery. What are the causes? This condition is caused by an increase in harmful bacteria that are normally present in small amounts in the vagina. However, the reason that the condition develops is not fully understood. What increases the risk? The following factors may make you more likely to develop this condition:  Having a new sexual partner or multiple sexual partners.  Having unprotected sex.  Douching.  Having an intrauterine device (IUD).  Smoking.  Drug and alcohol abuse.  Taking certain antibiotic medicines.  Being pregnant. You cannot get bacterial vaginosis from toilet seats, bedding, swimming pools, or contact with objects around you. What are the signs or symptoms? Symptoms of this condition include:  Grey or white vaginal discharge. The discharge can also be watery or foamy.  A fish-like odor with discharge, especially after sexual intercourse or during menstruation.  Itching in and around the vagina.  Burning or pain with urination. Some women with bacterial vaginosis have no signs or symptoms. How is  this diagnosed? This condition is diagnosed based on:  Your medical history.  A physical exam of the vagina.  Testing a sample of vaginal fluid under a microscope to look for a large amount of bad bacteria or abnormal cells. Your health care provider may use a cotton swab or a small wooden spatula to collect the sample. How is this treated? This condition is treated with antibiotics. These may be given as a pill, a vaginal cream, or a medicine that is put into the vagina (suppository). If the condition comes back after treatment, a second round of antibiotics may be needed. Follow these instructions at home: Medicines  Take over-the-counter and prescription medicines only as told by your health care provider.  Take or use your antibiotic as told by your health care provider. Do not stop taking or using the antibiotic even if you start to feel better. General instructions  If you have a female sexual partner, tell her that you have a vaginal infection. She should see her health care provider and be treated if she has symptoms. If you have a female sexual partner, he does not need treatment.  During treatment: ? Avoid sexual activity until you finish treatment. ? Do not douche. ? Avoid alcohol as directed by your health care provider. ? Avoid breastfeeding as directed by your health care provider.  Drink enough water and fluids to keep your urine clear or pale yellow.  Keep the area around your vagina and rectum clean. ? Wash the area daily with warm water. ? Wipe yourself from front to back after using the toilet.  Keep all follow-up visits as told by your  health care provider. This is important. How is this prevented?  Do not douche.  Wash the outside of your vagina with warm water only.  Use protection when having sex. This includes latex condoms and dental dams.  Limit how many sexual partners you have. To help prevent bacterial vaginosis, it is best to have sex with just one  partner (monogamous).  Make sure you and your sexual partner are tested for STIs.  Wear cotton or cotton-lined underwear.  Avoid wearing tight pants and pantyhose, especially during summer.  Limit the amount of alcohol that you drink.  Do not use any products that contain nicotine or tobacco, such as cigarettes and e-cigarettes. If you need help quitting, ask your health care provider.  Do not use illegal drugs. Where to find more information  Centers for Disease Control and Prevention: SolutionApps.co.zawww.cdc.gov/std  American Sexual Health Association (ASHA): www.ashastd.org  U.S. Department of Health and Health and safety inspectorHuman Services, Office on Women's Health: ConventionalMedicines.siwww.womenshealth.gov/ or http://www.anderson-williamson.info/https://www.womenshealth.gov/a-z-topics/bacterial-vaginosis Contact a health care provider if:  Your symptoms do not improve, even after treatment.  You have more discharge or pain when urinating.  You have a fever.  You have pain in your abdomen.  You have pain during sex.  You have vaginal bleeding between periods. Summary  Bacterial vaginosis is a vaginal infection that occurs when the normal balance of bacteria in the vagina is disrupted.  Because bacterial vaginosis increases your risk for STIs (sexually transmitted infections), getting treated can help reduce your risk for chlamydia, gonorrhea, herpes, and HIV (human immunodeficiency virus). Treatment is also important for preventing complications in pregnant women, because the condition can cause an early (premature) delivery.  This condition is treated with antibiotic medicines. These may be given as a pill, a vaginal cream, or a medicine that is put into the vagina (suppository). This information is not intended to replace advice given to you by your health care provider. Make sure you discuss any questions you have with your health care provider. Document Released: 05/06/2005 Document Revised: 04/18/2017 Document Reviewed: 01/20/2016 Elsevier Patient Education   2020 [employer]

## 2018-12-02 NOTE — MAU Note (Signed)
Pt reports to MAU pt denies bleeding but reports a vaginal discharge that is creamy white with no odor. Pt reports lower abdominal cramping that is 2-4/10. Pt reports some lightheadedness.  LMP June 10th 2020

## 2018-12-03 LAB — GC/CHLAMYDIA PROBE AMP (~~LOC~~) NOT AT ARMC
Chlamydia: NEGATIVE
Neisseria Gonorrhea: NEGATIVE

## 2018-12-05 ENCOUNTER — Other Ambulatory Visit: Payer: Self-pay

## 2018-12-05 ENCOUNTER — Telehealth (HOSPITAL_COMMUNITY): Payer: Self-pay

## 2018-12-05 ENCOUNTER — Inpatient Hospital Stay (HOSPITAL_COMMUNITY)
Admission: AD | Admit: 2018-12-05 | Discharge: 2018-12-05 | Disposition: A | Discharge status: Home / Self Care | Payer: Medicaid Other | Attending: Obstetrics and Gynecology | Admitting: Obstetrics and Gynecology

## 2018-12-05 DIAGNOSIS — Z3A01 Less than 8 weeks gestation of pregnancy: Secondary | ICD-10-CM | POA: Insufficient documentation

## 2018-12-05 DIAGNOSIS — O3680X0 Pregnancy with inconclusive fetal viability, not applicable or unspecified: Secondary | ICD-10-CM

## 2018-12-05 DIAGNOSIS — O26891 Other specified pregnancy related conditions, first trimester: Secondary | ICD-10-CM | POA: Insufficient documentation

## 2018-12-05 LAB — HCG, QUANTITATIVE, PREGNANCY: hCG, Beta Chain, Quant, S: 1082 m[IU]/mL — ABNORMAL HIGH (ref <5)

## 2018-12-05 NOTE — Progress Notes (Signed)
PAtient Gina Lucas is a 23 y.o. G2P0010 At [redacted]w[redacted]d here for follow-up bhcg. Beta was 321 two days ago. She denies pain or bleeding today; denies any other ob-gyn complaints.   Repeat beta HCG done today; patient agrees to be available by phone this afternoon to discuss plan of care; she knows that she might need to have another follow up quant in two days. Reviewed ectopic precautions; all questions answered.   Maye Hides

## 2018-12-05 NOTE — Telephone Encounter (Signed)
Gina Lucas October 24, 1995 [id number]  Patient called and verified her identity via birth date and last 21 of her SSN.  Patient agreeable to results via phone and was informed of appropriate increase in beta hCG level today.  Informed that next step would be to repeat US in 10-14 days.  Patient verbalizes understanding and questions when she should make appt.  Informed that order would be placed and [place] would contact.  Instructed to not report to MAU for [place].  Patient without further questions or concerns.  Congratulations given.  Maryann Conners MSN, CNM 12/05/2018 2:40 PM  **This visit was completed, in its entirety, via telehealth communications.  I personally spent >/=2 minutes on the phone providing recommendations, education, and guidance.**

## 2018-12-05 NOTE — MAU Note (Signed)
Here for repeat HCG level.. states is doing well. No pain or bleeding.

## 2018-12-22 ENCOUNTER — Ambulatory Visit (HOSPITAL_COMMUNITY): Payer: Medicaid Other

## 2018-12-22 ENCOUNTER — Ambulatory Visit: Payer: Medicaid Other

## 2020-05-20 NOTE — L&D Delivery Note (Addendum)
LABOR COURSE Gina Lucas is a 25yo female who presented for an induction of labor at [redacted]w[redacted]d for A2GDM. She had a foley placed as an outpatient and came in at 5/80/-2. She received pitocin and was AROMed at 0440 on 8/20. She eventually progressed to complete.   Delivery Note Called to room and patient was complete and pushing. Head delivered LOA. There was a tight nuchal cord. Shoulder and body delivered in somersault fashion due to cord. At  1212 a healthy female was delivered via Vaginal, Spontaneous cephalic presentation.  Infant with spontaneous cry, placed on mother's abdomen, dried and stimulated. Cord clamped x 2 after 1-2-minute delay, and cut by FOB. Cord blood drawn. Placenta delivered spontaneously with gentle cord traction. Appears intact. Fundus firm with massage and Pitocin. Labia, perineum, vagina, and cervix inspected with a Right Labial tear.    APGAR: 8,9 ; weight 3476g .   Cord: 3VC with the following complications:None.    Anesthesia:  Epidural Episiotomy: None Lacerations: Right labial Suture Repair: 3.0 vicryl Est. Blood Loss (mL): 250  Mom to postpartum.  Baby to Couplet care / Skin to Skin.  Alicia Amel, MD 01/06/21 1:39 PM

## 2020-06-12 ENCOUNTER — Other Ambulatory Visit: Payer: Self-pay

## 2020-06-12 ENCOUNTER — Inpatient Hospital Stay (HOSPITAL_COMMUNITY)
Admission: AD | Admit: 2020-06-12 | Discharge: 2020-06-12 | Disposition: A | Discharge status: Home / Self Care | Payer: Medicaid Other | Attending: Obstetrics and Gynecology | Admitting: Obstetrics and Gynecology

## 2020-06-12 ENCOUNTER — Encounter (HOSPITAL_COMMUNITY): Payer: Self-pay | Admitting: Obstetrics and Gynecology

## 2020-06-12 ENCOUNTER — Inpatient Hospital Stay (HOSPITAL_COMMUNITY): Payer: Medicaid Other

## 2020-06-12 DIAGNOSIS — O26891 Other specified pregnancy related conditions, first trimester: Secondary | ICD-10-CM

## 2020-06-12 DIAGNOSIS — R1031 Right lower quadrant pain: Secondary | ICD-10-CM | POA: Diagnosis not present

## 2020-06-12 DIAGNOSIS — N898 Other specified noninflammatory disorders of vagina: Secondary | ICD-10-CM | POA: Insufficient documentation

## 2020-06-12 DIAGNOSIS — R109 Unspecified abdominal pain: Secondary | ICD-10-CM

## 2020-06-12 DIAGNOSIS — Z3A01 Less than 8 weeks gestation of pregnancy: Secondary | ICD-10-CM | POA: Insufficient documentation

## 2020-06-12 DIAGNOSIS — O26899 Other specified pregnancy related conditions, unspecified trimester: Secondary | ICD-10-CM

## 2020-06-12 DIAGNOSIS — Z87891 Personal history of nicotine dependence: Secondary | ICD-10-CM | POA: Diagnosis not present

## 2020-06-12 LAB — CBC WITH DIFFERENTIAL/PLATELET
Abs Immature Granulocytes: 0.04 10*3/uL (ref 0.00–0.07)
Basophils Absolute: 0 10*3/uL (ref 0.0–0.1)
Basophils Relative: 0 %
Eosinophils Absolute: 0.3 10*3/uL (ref 0.0–0.5)
Eosinophils Relative: 2 %
HCT: 39.7 % (ref 36.0–46.0)
Hemoglobin: 13.2 g/dL (ref 12.0–15.0)
Immature Granulocytes: 0 %
Lymphocytes Relative: 25 %
Lymphs Abs: 2.9 10*3/uL (ref 0.7–4.0)
MCH: 29.8 pg (ref 26.0–34.0)
MCHC: 33.2 g/dL (ref 30.0–36.0)
MCV: 89.6 fL (ref 80.0–100.0)
Monocytes Absolute: 0.9 10*3/uL (ref 0.1–1.0)
Monocytes Relative: 8 %
Neutro Abs: 7.7 10*3/uL (ref 1.7–7.7)
Neutrophils Relative %: 65 %
Platelets: 233 10*3/uL (ref 150–400)
RBC: 4.43 MIL/uL (ref 3.87–5.11)
RDW: 12.4 % (ref 11.5–15.5)
WBC: 11.9 10*3/uL — ABNORMAL HIGH (ref 4.0–10.5)
nRBC: 0 % (ref 0.0–0.2)

## 2020-06-12 LAB — URINALYSIS, ROUTINE W REFLEX MICROSCOPIC
Bilirubin Urine: NEGATIVE
Glucose, UA: NEGATIVE mg/dL
Hgb urine dipstick: NEGATIVE
Ketones, ur: NEGATIVE mg/dL
Leukocytes,Ua: NEGATIVE
Nitrite: NEGATIVE
Protein, ur: NEGATIVE mg/dL
Specific Gravity, Urine: 1.024 (ref 1.005–1.030)
pH: 5 (ref 5.0–8.0)

## 2020-06-12 LAB — WET PREP, GENITAL
Sperm: NONE SEEN
Trich, Wet Prep: NONE SEEN

## 2020-06-12 LAB — COMPREHENSIVE METABOLIC PANEL
ALT: 20 U/L (ref 0–44)
AST: 20 U/L (ref 15–41)
Albumin: 3.8 g/dL (ref 3.5–5.0)
Alkaline Phosphatase: 33 U/L — ABNORMAL LOW (ref 38–126)
Anion gap: 9 (ref 5–15)
BUN: 7 mg/dL (ref 6–20)
CO2: 23 mmol/L (ref 22–32)
Calcium: 8.9 mg/dL (ref 8.9–10.3)
Chloride: 104 mmol/L (ref 98–111)
Creatinine, Ser: 0.63 mg/dL (ref 0.44–1.00)
GFR, Estimated: >60 mL/min (ref >60)
Glucose, Bld: 98 mg/dL (ref 70–99)
Potassium: 3.7 mmol/L (ref 3.5–5.1)
Sodium: 136 mmol/L (ref 135–145)
Total Bilirubin: 0.9 mg/dL (ref 0.3–1.2)
Total Protein: 6.7 g/dL (ref 6.5–8.1)

## 2020-06-12 LAB — HCG, QUANTITATIVE, PREGNANCY: hCG, Beta Chain, Quant, S: 78348 m[IU]/mL — ABNORMAL HIGH (ref <5)

## 2020-06-12 LAB — HIV ANTIBODY (ROUTINE TESTING W REFLEX): HIV Screen 4th Generation wRfx: NONREACTIVE

## 2020-06-12 NOTE — Discharge Instructions (Signed)
Subchorionic Hematoma  A hematoma is a collection of blood outside of the blood vessels. A subchorionic hematoma is a collection of blood between the outer wall of the embryo (chorion) and the inner wall of the uterus. This condition can cause vaginal bleeding. Early small hematomas usually shrink on their own and do not affect your baby or pregnancy. When bleeding starts later in pregnancy, or if the hematoma is larger or occurs in older pregnant women, the condition may be more serious. Larger hematomas increase the chances of miscarriage. This condition also increases the risk of:  Premature separation of the placenta from the uterus.  Premature (preterm) labor.  Stillbirth. What are the causes? The exact cause of this condition is not known. It occurs when blood is trapped between the placenta and the uterine wall because the placenta has separated from the original site of implantation. What increases the risk? You are more likely to develop this condition if:  You were treated with fertility medicines.  You became pregnant through in vitro fertilization (IVF). What are the signs or symptoms? Symptoms of this condition include:  Vaginal spotting or bleeding.  Abdominal pain. This is rare. Sometimes you may have no symptoms and the bleeding may only be seen when ultrasound images are taken (transvaginal ultrasound). How is this diagnosed? This condition is diagnosed based on a physical exam. This includes a pelvic exam. You may also have other tests, including:  Blood tests.  Urine tests.  Ultrasound of the abdomen. How is this treated? Treatment for this condition can vary. Treatment may include:  Watchful waiting. You will be monitored closely for any changes in bleeding.  Medicines.  Activity restriction. This may be needed until the bleeding stops.  A medicine called Rh immunoglobulin. This is given if you have an Rh-negative blood type. It prevents Rh  sensitization. Follow these instructions at home:  Stay on bed rest if told to do so by your health care provider.  Do not lift anything that is heavier than 10 lb (4.5 kg), or the limit that you are told by your health care provider.  Track and write down the number of pads you use each day and how soaked (saturated) they are.  Do not use tampons.  Keep all follow-up visits. This is important. Your health care provider may ask you to have follow-up blood tests or ultrasound tests or both. Contact a health care provider if:  You have any vaginal bleeding.  You have a fever. Get help right away if:  You have severe cramps in your stomach, back, abdomen, or pelvis.  You pass large clots or tissue. Save any tissue for your health care provider to look at.  You faint.  You become light-headed or weak. Summary  A subchorionic hematoma is a collection of blood between the outer wall of the embryo (chorion) and the inner wall of the uterus.  This condition can cause vaginal bleeding.  Sometimes you may have no symptoms and the bleeding may only be seen when ultrasound images are taken.  Treatment may include watchful waiting, medicines, or activity restriction.  Keep all follow-up visits. Get help right away if you have severe cramps or heavy vaginal bleeding. This information is not intended to replace advice given to you by your health care provider. Make sure you discuss any questions you have with your health care provider. Document Revised: 01/31/2020 Document Reviewed: 01/31/2020 Elsevier Patient Education  2021 Elsevier Inc. https://www.cdc.gov/pregnancy/infections.html">  First Trimester of Pregnancy    The first trimester of pregnancy starts on the first day of your last menstrual period until the end of week 12. This is also called months 1 through 3 of pregnancy. Body changes during your first trimester Your body goes through many changes during pregnancy. The changes  usually return to normal after your baby is born. Physical changes  You may gain or lose weight.  Your breasts may grow larger and hurt. The area around your nipples may get darker.  Dark spots or blotches may develop on your face.  You may have changes in your hair. Health changes  You may feel like you might vomit (nauseous), and you may vomit.  You may have heartburn.  You may have headaches.  You may have trouble pooping (constipation).  Your gums may bleed. Other changes  You may get tired easily.  You may pee (urinate) more often.  Your menstrual periods will stop.  You may not feel hungry.  You may want to eat certain kinds of food.  You may have changes in your emotions from day to day.  You may have more dreams. Follow these instructions at home: Medicines  Take over-the-counter and prescription medicines only as told by your doctor. Some medicines are not safe during pregnancy.  Take a prenatal vitamin that contains at least 600 micrograms (mcg) of folic acid. Eating and drinking  Eat healthy meals that include: ? Fresh fruits and vegetables. ? Whole grains. ? Good sources of protein, such as meat, eggs, or tofu. ? Low-fat dairy products.  Avoid raw meat and unpasteurized juice, milk, and cheese.  If you feel like you may vomit, or you vomit: ? Eat 4 or 5 small meals a day instead of 3 large meals. ? Try eating a few soda crackers. ? Drink liquids between meals instead of during meals.  You may need to take these actions to prevent or treat trouble pooping: ? Drink enough fluids to keep your pee (urine) pale yellow. ? Eat foods that are high in fiber. These include beans, whole grains, and fresh fruits and vegetables. ? Limit foods that are high in fat and sugar. These include fried or sweet foods. Activity  Exercise only as told by your doctor. Most people can do their usual exercise routine during pregnancy.  Stop exercising if you have  cramps or pain in your lower belly (abdomen) or low back.  Do not exercise if it is too hot or too humid, or if you are in a place of great height (high altitude).  Avoid heavy lifting.  If you choose to, you may have sex unless your doctor tells you not to. Relieving pain and discomfort  Wear a good support bra if your breasts are sore.  Rest with your legs raised (elevated) if you have leg cramps or low back pain.  If you have bulging veins (varicose veins) in your legs: ? Wear support hose as told by your doctor. ? Raise your feet for 15 minutes, 3-4 times a day. ? Limit salt in your food. Safety  Wear your seat belt at all times when you are in a car.  Talk with your doctor if someone is hurting you or yelling at you.  Talk with your doctor if you are feeling sad or have thoughts of hurting yourself. Lifestyle  Do not use hot tubs, steam rooms, or saunas.  Do not douche. Do not use tampons or scented sanitary pads.  Do not use herbal medicines, illegal drugs, or   medicines that are not approved by your doctor. Do not drink alcohol.  Do not smoke or use any products that contain nicotine or tobacco. If you need help quitting, ask your doctor.  Avoid cat litter boxes and soil that is used by cats. These carry germs that can cause harm to the baby and can cause a loss of your baby by miscarriage or stillbirth. General instructions  Keep all follow-up visits. This is important.  Ask for help if you need counseling or if you need help with nutrition. Your doctor can give you advice or tell you where to go for help.  Visit your dentist. At home, brush your teeth with a soft toothbrush. Floss gently.  Write down your questions. Take them to your prenatal visits. Where to find more information  American Pregnancy Association: americanpregnancy.org  American College of Obstetricians and Gynecologists: www.acog.org  Office on Women's Health:  womenshealth.gov/pregnancy Contact a doctor if:  You are dizzy.  You have a fever.  You have mild cramps or pressure in your lower belly.  You have a nagging pain in your belly area.  You continue to feel like you may vomit, you vomit, or you have watery poop (diarrhea) for 24 hours or longer.  You have a bad-smelling fluid coming from your vagina.  You have pain when you pee.  You are exposed to a disease that spreads from person to person, such as chickenpox, measles, Zika virus, HIV, or hepatitis. Get help right away if:  You have spotting or bleeding from your vagina.  You have very bad belly cramping or pain.  You have shortness of breath or chest pain.  You have any kind of injury, such as from a fall or a car crash.  You have new or increased pain, swelling, or redness in an arm or leg. Summary  The first trimester of pregnancy starts on the first day of your last menstrual period until the end of week 12 (months 1 through 3).  Eat 4 or 5 small meals a day instead of 3 large meals.  Do not smoke or use any products that contain nicotine or tobacco. If you need help quitting, ask your doctor.  Keep all follow-up visits. This information is not intended to replace advice given to you by your health care provider. Make sure you discuss any questions you have with your health care provider. Document Revised: 10/13/2019 Document Reviewed: 08/19/2019 Elsevier Patient Education  2021 Elsevier Inc.  

## 2020-06-12 NOTE — MAU Note (Signed)
..  Gina Lucas is a 25 y.o. at [redacted]w[redacted]d here in MAU reporting: right lower abdominal pain that began on 06/08/20, she has had 2 visits for blood work with Dr. Shawnie Pons in [facility] and came in tonight because the abdominal pain has increased. Denies vaginal bleeding.   Pain score: 6/10 Vitals:   06/12/20 2034  BP: 122/68  Pulse: 83  Resp: 19  Temp: 98.9 F (37.2 C)  SpO2: 99%     Lab orders placed from triage: UA

## 2020-06-13 ENCOUNTER — Other Ambulatory Visit: Payer: Self-pay | Admitting: Certified Nurse Midwife

## 2020-06-13 DIAGNOSIS — B373 Candidiasis of vulva and vagina: Secondary | ICD-10-CM

## 2020-06-13 DIAGNOSIS — B3731 Acute candidiasis of vulva and vagina: Secondary | ICD-10-CM

## 2020-06-13 DIAGNOSIS — B9689 Other specified bacterial agents as the cause of diseases classified elsewhere: Secondary | ICD-10-CM

## 2020-06-13 LAB — GC/CHLAMYDIA PROBE AMP (~~LOC~~) NOT AT ARMC
Chlamydia: NEGATIVE
Comment: NEGATIVE
Comment: NORMAL
Neisseria Gonorrhea: NEGATIVE

## 2020-06-13 MED ORDER — TERCONAZOLE 0.4 % VA CREA: 1.0000 | TOPICAL_CREAM | Freq: Every day | VAGINAL | 0 refills | Status: DC

## 2020-06-13 MED ORDER — METRONIDAZOLE 500 MG PO TABS: 500.0000 mg | ORAL_TABLET | Freq: Two times a day (BID) | ORAL | 0 refills | Status: DC

## 2020-06-13 NOTE — Progress Notes (Signed)
Prescriptions for BV and yeast sent to pharmacy. Pt notified via MyChart message.  Edd Arbour, CNM, MSN, IBCLC Certified Nurse Midwife, [facility]

## 2020-06-13 NOTE — MAU Provider Note (Signed)
Chief Complaint:  Abdominal Pain   Event Date/Time   First Provider Initiated Contact with Patient 06/12/20 2223     HPI: Gina Lucas is a 25 y.o. G4P0030 at [redacted]w[redacted]d who presents to maternity admissions reporting lower abdominal pain that has been going on for the past 3-4 days but has gotten worse today. No vaginal bleeding but some increased discharge and vaginal irritation, states she is prone to yeast and BV. No urinary symptoms.  Location: diffuse across the lower abdomen, has not noticed it worse on either side Quality: aching Severity: 8/10 in pain scale Duration: 3-4 days Modifying factors: None Associated signs and symptoms: None  Pregnancy Course: Receives care from Dr. Shawnie Pons in [facility], has been having repeat bHCGs for hx of SAB x3  Past Medical History:  Diagnosis Date  . Medical history non-contributory    OB History  Gravida Para Term Preterm AB Living  4       3    SAB IAB Ectopic Multiple Live Births  3            # Outcome Date GA Lbr Len/2nd Weight Sex Delivery Anes PTL Lv  4 Current           3 SAB 10/2019          2 SAB 08/2018          1 SAB 09/2016           Past Surgical History:  Procedure Laterality Date  . BUNIONECTOMY Right    Family History  Problem Relation Age of Onset  . Diabetes Mother   . Hypertension Mother   . Bipolar disorder Mother   . Heart disease Mother   . Hypertension Father    Social History   Tobacco Use  . Smoking status: Former Games developer  . Smokeless tobacco: Never Used  . Tobacco comment: 05/20/2020  Substance Use Topics  . Alcohol use: Yes    Comment: ocasionally  . Drug use: No   Allergies  Allergen Reactions  . Other Itching    Blue Cheese  . Vinegar [Acetic Acid] Hives   No medications prior to admission.    I have reviewed patient's Past Medical Hx, Surgical Hx, Family Hx, Social Hx, medications and allergies.   ROS:  Review of Systems  Constitutional: Negative for fatigue and fever.  HENT: Negative  for congestion and sore throat.   Gastrointestinal: Positive for abdominal pain. Negative for nausea and vomiting.  Genitourinary: Negative for vaginal bleeding.  All other systems reviewed and are negative.   Physical Exam  Pt workup done out of triage room due to high MAU volume  Patient Vitals for the past 24 hrs:  BP Temp Temp src Pulse Resp SpO2 Height Weight  06/12/20 2219 119/67 -- -- -- -- -- -- --  06/12/20 2034 122/68 98.9 F (37.2 C) Oral 83 19 99 % 5' 8.5" (1.74 m) 257 lb 3.2 oz (116.7 kg)   Constitutional: Well-developed, well-nourished female in no acute distress.  Cardiovascular: normal rate & rhythm Respiratory: normal effort GI: Abd soft, non-tender MS: Extremities nontender, no edema, normal ROM Neurologic: Alert and oriented x 4.  GU: no CVA tenderness Pelvic:  Pelvic exam deferred, blind swabs obtained  Labs: Results for orders placed or performed during the hospital encounter of 06/12/20 (from the past 24 hour(s))  CBC with Differential/Platelet     Status: Abnormal   Collection Time: 06/12/20  8:53 PM  Result Value Ref Range  WBC 11.9 (H) 4.0 - 10.5 K/uL   RBC 4.43 3.87 - 5.11 MIL/uL   Hemoglobin 13.2 12.0 - 15.0 g/dL   HCT 71.2 45.8 - 09.9 %   MCV 89.6 80.0 - 100.0 fL   MCH 29.8 26.0 - 34.0 pg   MCHC 33.2 30.0 - 36.0 g/dL   RDW 83.3 82.5 - 05.3 %   Platelets 233 150 - 400 K/uL   nRBC 0.0 0.0 - 0.2 %   Neutrophils Relative % 65 %   Neutro Abs 7.7 1.7 - 7.7 K/uL   Lymphocytes Relative 25 %   Lymphs Abs 2.9 0.7 - 4.0 K/uL   Monocytes Relative 8 %   Monocytes Absolute 0.9 0.1 - 1.0 K/uL   Eosinophils Relative 2 %   Eosinophils Absolute 0.3 0.0 - 0.5 K/uL   Basophils Relative 0 %   Basophils Absolute 0.0 0.0 - 0.1 K/uL   Immature Granulocytes 0 %   Abs Immature Granulocytes 0.04 0.00 - 0.07 K/uL  Comprehensive metabolic panel     Status: Abnormal   Collection Time: 06/12/20  8:53 PM  Result Value Ref Range   Sodium 136 135 - 145 mmol/L    Potassium 3.7 3.5 - 5.1 mmol/L   Chloride 104 98 - 111 mmol/L   CO2 23 22 - 32 mmol/L   Glucose, Bld 98 70 - 99 mg/dL   BUN 7 6 - 20 mg/dL   Creatinine, Ser 9.76 0.44 - 1.00 mg/dL   Calcium 8.9 8.9 - 73.4 mg/dL   Total Protein 6.7 6.5 - 8.1 g/dL   Albumin 3.8 3.5 - 5.0 g/dL   AST 20 15 - 41 U/L   ALT 20 0 - 44 U/L   Alkaline Phosphatase 33 (L) 38 - 126 U/L   Total Bilirubin 0.9 0.3 - 1.2 mg/dL   GFR, Estimated >19 >37 mL/min   Anion gap 9 5 - 15  hCG, quantitative, pregnancy     Status: Abnormal   Collection Time: 06/12/20  8:53 PM  Result Value Ref Range   hCG, Beta Chain, Quant, S 78,348 (H) <5 mIU/mL  HIV Antibody (routine testing w rflx)     Status: None   Collection Time: 06/12/20  8:53 PM  Result Value Ref Range   HIV Screen 4th Generation wRfx Non Reactive Non Reactive  Urinalysis, Routine w reflex microscopic Urine, Clean Catch     Status: Abnormal   Collection Time: 06/12/20  9:57 PM  Result Value Ref Range   Color, Urine YELLOW YELLOW   APPearance HAZY (A) CLEAR   Specific Gravity, Urine 1.024 1.005 - 1.030   pH 5.0 5.0 - 8.0   Glucose, UA NEGATIVE NEGATIVE mg/dL   Hgb urine dipstick NEGATIVE NEGATIVE   Bilirubin Urine NEGATIVE NEGATIVE   Ketones, ur NEGATIVE NEGATIVE mg/dL   Protein, ur NEGATIVE NEGATIVE mg/dL   Nitrite NEGATIVE NEGATIVE   Leukocytes,Ua NEGATIVE NEGATIVE  Wet prep, genital     Status: Abnormal   Collection Time: 06/12/20  9:57 PM   Specimen: Urine, Clean Catch  Result Value Ref Range   Yeast Wet Prep HPF POC PRESENT (A) NONE SEEN   Trich, Wet Prep NONE SEEN NONE SEEN   Clue Cells Wet Prep HPF POC PRESENT (A) NONE SEEN   WBC, Wet Prep HPF POC MANY (A) NONE SEEN   Sperm NONE SEEN     Imaging:  US OB LESS THAN 14 WEEKS WITH OB TRANSVAGINAL  Result Date: 06/12/2020 CLINICAL DATA:  Right lower  quadrant cramping EXAM: OBSTETRIC <14 WK [place] AND TRANSVAGINAL OB US TECHNIQUE: Both transabdominal and transvaginal ultrasound examinations were  performed for complete evaluation of the gestation as well as the maternal uterus, adnexal regions, and pelvic cul-de-sac. Transvaginal technique was performed to assess early pregnancy. COMPARISON:  None. FINDINGS: Intrauterine gestational sac: Single intrauterine gestation Yolk sac:  Visualized Embryo:  Visualized Cardiac Activity: Visualized Heart Rate: 148 bpm CRL: 13.1 mm   7 w   4 d                  [place] EDC: 01/25/2021 Subchorionic hemorrhage:  Small subchorionic hemorrhage. Maternal uterus/adnexae: Ovaries are within normal limits. Right ovary measures 2.5 x 2.7 x 2.1 cm. Left ovary measures 2.3 x 1.1 x 2.7 cm. Trace free fluid IMPRESSION: Single viable intrauterine pregnancy as above. Small subchorionic hemorrhage. Electronically Signed   By: Jasmine Pang M.D.   On: 06/12/2020 21:34    MAU Course: Orders Placed This Encounter  Procedures  . Wet prep, genital  . US OB LESS THAN 14 WEEKS WITH OB TRANSVAGINAL  . Urinalysis, Routine w reflex microscopic Urine, Clean Catch  . CBC with Differential/Platelet  . Comprehensive metabolic panel  . hCG, quantitative, pregnancy  . HIV Antibody (routine testing w rflx)  . Discharge patient   No orders of the defined types were placed in this encounter.  MDM: Ectopic workup normal O+ blood type  Explained origin and expected course of resolution r/t [facility], reassured her that some cramping and spotting is to be expected but should resolve. U/S pictures given to patient.  Blind swabs collected, pt to be discharged but will be managed as indicated  Assessment: 1. Vaginal discharge during pregnancy in first trimester   2. Abdominal cramping affecting pregnancy     Plan: Discharge home in stable condition with bleeding precautions.  Will follow up and manage wet prep/UA results    Follow-up Information    Alm Bustard, MD. Go to.   Specialty: Obstetrics and Gynecology Why: as scheduled for prenatal care Contact information: [address] [place] [address] [phone]               Allergies as of 06/12/2020      Reactions   Other Itching   Blue Cheese   Vinegar [acetic Acid] Hives      Medication List    STOP taking these medications   metroNIDAZOLE 500 MG tablet Commonly known as: FLAGYL       Edd Arbour, CNM, MSN, IBCLC Certified Nurse Midwife, [facility]

## 2020-06-13 NOTE — Addendum Note (Signed)
Addended by: Edd Arbour on: 06/13/2020 09:09 AM   Modules accepted: Orders

## 2020-06-21 ENCOUNTER — Emergency Department (HOSPITAL_COMMUNITY)
Admission: EM | Admit: 2020-06-21 | Discharge: 2020-06-22 | Disposition: A | Discharge status: Home / Self Care | Payer: Medicaid Other | Attending: Emergency Medicine | Admitting: Emergency Medicine

## 2020-06-21 ENCOUNTER — Other Ambulatory Visit: Payer: Self-pay

## 2020-06-21 ENCOUNTER — Encounter (HOSPITAL_COMMUNITY): Payer: Self-pay

## 2020-06-21 DIAGNOSIS — R6884 Jaw pain: Secondary | ICD-10-CM | POA: Diagnosis not present

## 2020-06-21 DIAGNOSIS — Z3A09 9 weeks gestation of pregnancy: Secondary | ICD-10-CM | POA: Insufficient documentation

## 2020-06-21 DIAGNOSIS — Z5321 Procedure and treatment not carried out due to patient leaving prior to being seen by health care provider: Secondary | ICD-10-CM | POA: Diagnosis not present

## 2020-06-21 DIAGNOSIS — O26891 Other specified pregnancy related conditions, first trimester: Secondary | ICD-10-CM | POA: Insufficient documentation

## 2020-06-21 LAB — PREGNANCY, URINE: Preg Test, Ur: POSITIVE — AB

## 2020-06-21 NOTE — ED Notes (Signed)
Patient called x3 for vitals recheck with no response 
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
## 2020-07-12 ENCOUNTER — Encounter: Payer: Self-pay | Admitting: Family Medicine

## 2020-07-12 ENCOUNTER — Other Ambulatory Visit (HOSPITAL_COMMUNITY)
Admission: RE | Admit: 2020-07-12 | Discharge: 2020-07-12 | Disposition: A | Discharge status: Home / Self Care | Payer: Medicaid Other | Source: Ambulatory Visit | Attending: Family Medicine | Admitting: Family Medicine

## 2020-07-12 ENCOUNTER — Ambulatory Visit (INDEPENDENT_AMBULATORY_CARE_PROVIDER_SITE_OTHER): Payer: Medicaid Other | Admitting: Family Medicine

## 2020-07-12 ENCOUNTER — Other Ambulatory Visit: Payer: Self-pay

## 2020-07-12 VITALS — BP 114/74 | HR 77 | Wt 258.0 lb

## 2020-07-12 DIAGNOSIS — Z3A11 11 weeks gestation of pregnancy: Secondary | ICD-10-CM | POA: Insufficient documentation

## 2020-07-12 DIAGNOSIS — O09891 Supervision of other high risk pregnancies, first trimester: Secondary | ICD-10-CM

## 2020-07-12 DIAGNOSIS — N96 Recurrent pregnancy loss: Secondary | ICD-10-CM | POA: Insufficient documentation

## 2020-07-12 DIAGNOSIS — O09899 Supervision of other high risk pregnancies, unspecified trimester: Secondary | ICD-10-CM

## 2020-07-12 NOTE — Progress Notes (Signed)
Patient had dating ultrasound done with Dr. Desmond Dike office. Obtained fetal heart tones via ultrasound due to maternal habitus. Armandina Stammer RN

## 2020-07-12 NOTE — Progress Notes (Signed)
Subjective:  Gina Lucas is a [id number] [redacted]w[redacted]d being seen today for her first obstetrical visit.  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 FOB is patient's boyfriend. Strong family history of hypertension. Patient does intend to breast feed. Pregnancy history fully reviewed.  Patient reports occasional headaches.  BP 114/74   Pulse 77   Wt 258 lb (117 kg)   LMP 04/21/2020 (Exact Date)   BMI 39.23 kg/m   HISTORY: OB History  Gravida Para Term Preterm AB Living  4       3    SAB IAB Ectopic Multiple Live Births  3            # Outcome Date GA Lbr Len/2nd Weight Sex Delivery Anes PTL Lv  4 Current           3 SAB 10/2019          2 SAB 08/2018          1 SAB 09/2016            Past Medical History:  Diagnosis Date  . Medical history non-contributory     Past Surgical History:  Procedure Laterality Date  . BUNIONECTOMY Right     Family History  Problem Relation Age of Onset  . Diabetes Mother   . Hypertension Mother   . Bipolar disorder Mother   . Heart disease Mother   . Hypertension Father      Exam  BP 114/74   Pulse 77   Wt 258 lb (117 kg)   LMP 04/21/2020 (Exact Date)   BMI 39.23 kg/m   Chaperone present during exam  CONSTITUTIONAL: Well-developed, well-nourished female in no acute distress.  HENT:  Normocephalic, atraumatic, External right and left ear normal. Oropharynx is clear and moist EYES: Conjunctivae and EOM are normal. Pupils are equal, round, and reactive to light. No scleral icterus.  NECK: Normal range of motion, supple, no masses.  Normal thyroid.  CARDIOVASCULAR: Normal heart rate noted, regular rhythm RESPIRATORY: Clear to auscultation bilaterally. Effort and breath sounds normal, no problems with respiration noted. BREASTS: Symmetric in size. No masses, skin changes, nipple drainage, or lymphadenopathy. ABDOMEN: Soft, normal bowel sounds, no distention noted.  No tenderness, rebound or  guarding.  PELVIC: Normal appearing external genitalia; normal appearing vaginal mucosa and cervix. No abnormal discharge noted. Normal uterine size, no other palpable masses, no uterine or adnexal tenderness. MUSCULOSKELETAL: Normal range of motion. No tenderness.  No cyanosis, clubbing, or edema.  2+ distal pulses. SKIN: Skin is warm and dry. No rash noted. Not diaphoretic. No erythema. No pallor. NEUROLOGIC: Alert and oriented to person, place, and time. Normal reflexes, muscle tone coordination. No cranial nerve deficit noted. PSYCHIATRIC: Normal mood and affect. Normal behavior. Normal judgment and thought content.    Assessment:    Pregnancy: G4P0030 Patient Active Problem List   Diagnosis Date Noted  . Supervision of other high risk pregnancy, antepartum, unspecified trimester 07/12/2020  . 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 Grief associated with loss of fetus 10/08/2016      Plan:   1. Supervision of other high risk pregnancy, antepartum, unspecified trimester FHT and FH normal Discussed practice - delivery at [facility], use of midwives, fellows. Desires panorama. [place] at 18-20 weeks Check HgA1c Start ASA 81mg  at 12 weeks. - Hemoglobin A1c - Hemoglobpathy+Fer w/A Thal Rfx - CBC/D/Plt+RPR+Rh+ABO+Rub Ab... - Culture, OB Urine - MFM OB COMP + 14 WK; Future -  Cytology - PAP( [facility]) - [facility] AMB BABYSCRIPTS OPT IN - Genetic Screening  2. History of recurrent miscarriages - Hemoglobin A1c - Hemoglobpathy+Fer w/A Thal Rfx - CBC/D/Plt+RPR+Rh+ABO+Rub Ab... - Culture, OB Urine - [place] MFM OB COMP + 14 WK; Future - Cytology - PAP( [place]) - CH[facility] AMB BABYSCRIPTS OPT IN - Genetic Screening  3. [redacted] weeks gestation of pregnancy - Hemoglobin A1c - Hemoglobpathy+Fer w/A Thal Rfx - CBC/D/Plt+RPR+Rh+ABO+Rub Ab... - Culture, OB Urine - [place] MFM OB COMP + 14 WK; Future - Cytology - PAP( [place]) - CHL AMB BABYSCRIPTS OPT IN - Genetic Screening      Problem list reviewed and updated. 75% of 30 min visit spent on counseling and coordination of care.     Levie Heritage 07/12/2020

## 2020-07-13 LAB — CYTOLOGY - PAP
Chlamydia: NEGATIVE
Comment: NEGATIVE
Comment: NORMAL
Diagnosis: NEGATIVE
Neisseria Gonorrhea: NEGATIVE

## 2020-07-14 LAB — URINE CULTURE, OB REFLEX

## 2020-07-14 LAB — CULTURE, OB URINE

## 2020-07-18 LAB — CBC/D/PLT+RPR+RH+ABO+RUB AB...
Antibody Screen: NEGATIVE
Basophils Absolute: 0 10*3/uL (ref 0.0–0.2)
Basos: 0 %
EOS (ABSOLUTE): 0.2 10*3/uL (ref 0.0–0.4)
Eos: 3 %
HCV Ab: <0.1 s/co ratio (ref 0.0–0.9)
HIV Screen 4th Generation wRfx: NONREACTIVE
Hematocrit: 38.8 % (ref 34.0–46.6)
Hemoglobin: 13 g/dL (ref 11.1–15.9)
Hepatitis B Surface Ag: NEGATIVE
Immature Grans (Abs): 0 10*3/uL (ref 0.0–0.1)
Immature Granulocytes: 0 %
Lymphocytes Absolute: 2 10*3/uL (ref 0.7–3.1)
Lymphs: 25 %
MCH: 30.2 pg (ref 26.6–33.0)
MCHC: 33.5 g/dL (ref 31.5–35.7)
MCV: 90 fL (ref 79–97)
Monocytes Absolute: 0.5 10*3/uL (ref 0.1–0.9)
Monocytes: 6 %
Neutrophils Absolute: 5.4 10*3/uL (ref 1.4–7.0)
Neutrophils: 66 %
Platelets: 197 10*3/uL (ref 150–450)
RBC: 4.3 x10E6/uL (ref 3.77–5.28)
RDW: 12.6 % (ref 11.7–15.4)
RPR Ser Ql: NONREACTIVE
Rh Factor: POSITIVE
Rubella Antibodies, IGG: <0.9 index — ABNORMAL LOW (ref >0.99)
WBC: 8.1 10*3/uL (ref 3.4–10.8)

## 2020-07-18 LAB — HEMOGLOBIN A1C
Est. average glucose Bld gHb Est-mCnc: 114 mg/dL
Hgb A1c MFr Bld: 5.6 % (ref 4.8–5.6)

## 2020-07-18 LAB — HEMOGLOBPATHY+FER W/A THAL RFX
Ferritin: 63 ng/mL (ref 15–150)
Hgb A2: 2.8 % (ref 1.8–3.2)
Hgb A: 97.2 % (ref 96.4–98.8)
Hgb F: 0 % (ref 0.0–2.0)
Hgb S: 0 %

## 2020-07-18 LAB — HCV INTERPRETATION

## 2020-07-31 ENCOUNTER — Encounter: Payer: Self-pay | Admitting: General Practice

## 2020-08-07 ENCOUNTER — Telehealth: Payer: Self-pay

## 2020-08-07 NOTE — Telephone Encounter (Signed)
Pt called stating she is starting to break out with red bumps from her chin to her ears. Pt stats the bumps are not itching and was wanting to know if the breakout is likely hormonal. Pt made aware that it could be hormonal but it will need to be evaluated by the doctor. Pt sent pictures via MyChart. MyChart Message was sent to the provider. Asaph Serena l Brennen Gardiner, CMA

## 2020-08-09 ENCOUNTER — Ambulatory Visit (INDEPENDENT_AMBULATORY_CARE_PROVIDER_SITE_OTHER): Payer: Medicaid Other | Admitting: Family Medicine

## 2020-08-09 ENCOUNTER — Other Ambulatory Visit: Payer: Self-pay

## 2020-08-09 VITALS — BP 129/69 | HR 87 | Wt 258.0 lb

## 2020-08-09 DIAGNOSIS — Z3A15 15 weeks gestation of pregnancy: Secondary | ICD-10-CM | POA: Diagnosis not present

## 2020-08-09 DIAGNOSIS — N96 Recurrent pregnancy loss: Secondary | ICD-10-CM

## 2020-08-09 DIAGNOSIS — O09899 Supervision of other high risk pregnancies, unspecified trimester: Secondary | ICD-10-CM | POA: Diagnosis not present

## 2020-08-09 NOTE — Progress Notes (Signed)
   PRENATAL VISIT NOTE  Subjective:  Gina Lucas is a 25 y.o. G4P0030 at [redacted]w[redacted]d being seen today for ongoing prenatal care.  She is currently monitored for the following issues for this low-risk pregnancy and has Grief associated with loss of fetus; Supervision of other high risk pregnancy, antepartum, unspecified trimester; and History of recurrent miscarriages on their problem list.  Patient reports little red bumps on jawline and upper neck. Started a couple day after vomiting. Had a virutal urgent care visit - prescribed her hydrocortisone.  Contractions: Not present. Vag. Bleeding: None.  Movement: Absent. Denies leaking of fluid.   The following portions of the patient's history were reviewed and updated as appropriate: allergies, current medications, past family history, past medical history, past social history, past surgical history and problem list.   Objective:   Vitals:   08/09/20 0844  BP: 129/69  Pulse: 87  Weight: 258 lb (117 kg)    Fetal Status:     Movement: Absent     General:  Alert, oriented and cooperative. Patient is in no acute distress.  Skin: Skin is warm and dry. Petechial rash on upper neck.    Cardiovascular: Normal heart rate noted  Respiratory: Normal respiratory effort, no problems with respiration noted  Abdomen: Soft, gravid, appropriate for gestational age.  Pain/Pressure: Present     Pelvic: Cervical exam deferred        Extremities: Normal range of motion.  Edema: None  Mental Status: Normal mood and affect. Normal behavior. Normal judgment and thought content.   Assessment and Plan:  Pregnancy: G4P0030 at [redacted]w[redacted]d 1. [redacted] weeks gestation of pregnancy - AFP TETRA  2. Supervision of other high risk pregnancy, antepartum, unspecified trimester FTH and FH normal. Rash benign. Will watch for now. - AFP TETRA  3. History of recurrent miscarriages  Preterm labor symptoms and general obstetric precautions including but not limited to vaginal bleeding,  contractions, leaking of fluid and fetal movement were reviewed in detail with the patient. Please refer to After Visit Summary for other counseling recommendations.   No follow-ups on file.  Future Appointments  Date Time Provider Department Center  09/04/2020  8:30 AM [facility] NURSE [facility]  09/04/2020  8:45 AM [facility] US4 [facility]  09/06/2020  8:45 AM Adrian Blackwater, Rhona Raider, DO [facility] None    Levie Heritage, DO

## 2020-08-11 LAB — AFP TETRA
DIA Mom Value: 0.7
DIA Value (EIA): 86.46 pg/mL
DSR (By Age)    1 IN: 982
DSR (Second Trimester) 1 IN: 10000
Gestational Age: 15.5 WEEKS
MSAFP Mom: 0.99
MSAFP: 22.7 ng/mL
MSHCG Mom: 0.65
MSHCG: 22182 m[IU]/mL
Maternal Age At EDD: 26 yr
Osb Risk: 10000
T18 (By Age): 1:3826 {titer}
Test Results:: NEGATIVE
Weight: 258 [lb_av]
uE3 Mom: 0.94
uE3 Value: 0.7 ng/mL

## 2020-08-28 ENCOUNTER — Telehealth: Payer: Self-pay

## 2020-08-28 NOTE — Telephone Encounter (Signed)
Dental letter for patient.

## 2020-09-04 ENCOUNTER — Ambulatory Visit: Payer: Medicaid Other

## 2020-09-05 ENCOUNTER — Other Ambulatory Visit: Payer: Self-pay | Admitting: Family Medicine

## 2020-09-05 DIAGNOSIS — N96 Recurrent pregnancy loss: Secondary | ICD-10-CM

## 2020-09-05 DIAGNOSIS — O09899 Supervision of other high risk pregnancies, unspecified trimester: Secondary | ICD-10-CM

## 2020-09-05 DIAGNOSIS — O99212 Obesity complicating pregnancy, second trimester: Secondary | ICD-10-CM

## 2020-09-05 DIAGNOSIS — Z3A19 19 weeks gestation of pregnancy: Secondary | ICD-10-CM

## 2020-09-06 ENCOUNTER — Ambulatory Visit (INDEPENDENT_AMBULATORY_CARE_PROVIDER_SITE_OTHER): Payer: Medicaid Other | Admitting: Family Medicine

## 2020-09-06 ENCOUNTER — Other Ambulatory Visit: Payer: Self-pay

## 2020-09-06 VITALS — BP 123/63 | HR 80 | Wt 269.0 lb

## 2020-09-06 DIAGNOSIS — Z3A19 19 weeks gestation of pregnancy: Secondary | ICD-10-CM

## 2020-09-06 DIAGNOSIS — O09899 Supervision of other high risk pregnancies, unspecified trimester: Secondary | ICD-10-CM

## 2020-09-06 DIAGNOSIS — N96 Recurrent pregnancy loss: Secondary | ICD-10-CM

## 2020-09-06 NOTE — Progress Notes (Signed)
   PRENATAL VISIT NOTE  Subjective:  Gina Lucas is a 25 y.o. G4P0030 at [redacted]w[redacted]d being seen today for ongoing prenatal care.  She is currently monitored for the following issues for this low-risk pregnancy and has Grief associated with loss of fetus; Supervision of other high risk pregnancy, antepartum, unspecified trimester; and History of recurrent miscarriages on their problem list.  Patient reports no complaints.  Contractions: Not present.  .  Movement: Absent. Denies leaking of fluid.   The following portions of the patient's history were reviewed and updated as appropriate: allergies, current medications, past family history, past medical history, past social history, past surgical history and problem list.   Objective:   Vitals:   09/06/20 0849  BP: 123/63  Pulse: 80  Weight: 269 lb (122 kg)    Fetal Status: Fetal Heart Rate (bpm): 144   Movement: Absent     General:  Alert, oriented and cooperative. Patient is in no acute distress.  Skin: Skin is warm and dry. No rash noted.   Cardiovascular: Normal heart rate noted  Respiratory: Normal respiratory effort, no problems with respiration noted  Abdomen: Soft, gravid, appropriate for gestational age.  Pain/Pressure: Absent     Pelvic: Cervical exam deferred        Extremities: Normal range of motion.     Mental Status: Normal mood and affect. Normal behavior. Normal judgment and thought content.   Assessment and Plan:  Pregnancy: G4P0030 at [redacted]w[redacted]d 1. [redacted] weeks gestation of pregnancy  2. Supervision of other high risk pregnancy, antepartum, unspecified trimester FHT and FH normal  3. History of recurrent miscarriages  Preterm labor symptoms and general obstetric precautions including but not limited to vaginal bleeding, contractions, leaking of fluid and fetal movement were reviewed in detail with the patient. Please refer to After Visit Summary for other counseling recommendations.   No follow-ups on file.  Future  Appointments  Date Time Provider Department Center  09/08/2020  9:00 AM [facility] US1 [facility]  10/04/2020  8:30 AM Levie Heritage, DO [facility] None  11/01/2020  8:30 AM Levie Heritage, DO [facility] None  11/16/2020  8:30 AM Levie Heritage, DO [facility] None    Levie Heritage, DO

## 2020-09-08 ENCOUNTER — Ambulatory Visit: Payer: Medicaid Other | Attending: Family Medicine

## 2020-09-08 ENCOUNTER — Other Ambulatory Visit: Payer: Self-pay | Admitting: *Deleted

## 2020-09-08 ENCOUNTER — Other Ambulatory Visit: Payer: Self-pay

## 2020-09-08 ENCOUNTER — Ambulatory Visit: Payer: Medicaid Other

## 2020-09-08 DIAGNOSIS — O99212 Obesity complicating pregnancy, second trimester: Secondary | ICD-10-CM | POA: Diagnosis not present

## 2020-09-08 DIAGNOSIS — Z3A19 19 weeks gestation of pregnancy: Secondary | ICD-10-CM

## 2020-09-08 DIAGNOSIS — N96 Recurrent pregnancy loss: Secondary | ICD-10-CM

## 2020-09-08 DIAGNOSIS — Z6838 Body mass index (BMI) 38.0-38.9, adult: Secondary | ICD-10-CM

## 2020-09-08 DIAGNOSIS — O09899 Supervision of other high risk pregnancies, unspecified trimester: Secondary | ICD-10-CM

## 2020-09-11 ENCOUNTER — Inpatient Hospital Stay (HOSPITAL_COMMUNITY): Payer: Medicaid Other

## 2020-09-11 ENCOUNTER — Encounter (HOSPITAL_COMMUNITY): Payer: Self-pay | Admitting: Family Medicine

## 2020-09-11 ENCOUNTER — Inpatient Hospital Stay (HOSPITAL_COMMUNITY)
Admission: AD | Admit: 2020-09-11 | Discharge: 2020-09-11 | Disposition: A | Discharge status: Home / Self Care | Payer: Medicaid Other | Attending: Family Medicine | Admitting: Family Medicine

## 2020-09-11 ENCOUNTER — Other Ambulatory Visit: Payer: Self-pay

## 2020-09-11 DIAGNOSIS — R062 Wheezing: Secondary | ICD-10-CM | POA: Diagnosis not present

## 2020-09-11 DIAGNOSIS — Z87891 Personal history of nicotine dependence: Secondary | ICD-10-CM | POA: Insufficient documentation

## 2020-09-11 DIAGNOSIS — U071 COVID-19: Secondary | ICD-10-CM

## 2020-09-11 DIAGNOSIS — O98512 Other viral diseases complicating pregnancy, second trimester: Secondary | ICD-10-CM

## 2020-09-11 DIAGNOSIS — R079 Chest pain, unspecified: Secondary | ICD-10-CM

## 2020-09-11 DIAGNOSIS — Z3A20 20 weeks gestation of pregnancy: Secondary | ICD-10-CM | POA: Diagnosis not present

## 2020-09-11 DIAGNOSIS — R059 Cough, unspecified: Secondary | ICD-10-CM | POA: Diagnosis not present

## 2020-09-11 DIAGNOSIS — J069 Acute upper respiratory infection, unspecified: Secondary | ICD-10-CM | POA: Diagnosis not present

## 2020-09-11 HISTORY — DX: Unspecified abnormal cytological findings in specimens from vagina: R87.629

## 2020-09-11 HISTORY — DX: Anemia, unspecified: D64.9

## 2020-09-11 LAB — URINALYSIS, ROUTINE W REFLEX MICROSCOPIC
Bilirubin Urine: NEGATIVE
Glucose, UA: NEGATIVE mg/dL
Hgb urine dipstick: NEGATIVE
Ketones, ur: NEGATIVE mg/dL
Leukocytes,Ua: NEGATIVE
Nitrite: NEGATIVE
Protein, ur: NEGATIVE mg/dL
Specific Gravity, Urine: 1.025 (ref 1.005–1.030)
pH: 5 (ref 5.0–8.0)

## 2020-09-11 LAB — RESP PANEL BY RT-PCR (FLU A&B, COVID) ARPGX2
Influenza A by PCR: NEGATIVE
Influenza B by PCR: NEGATIVE
SARS Coronavirus 2 by RT PCR: POSITIVE — AB

## 2020-09-11 MED ORDER — GUAIFENESIN-CODEINE 100-10 MG/5ML PO SYRP: 5.0000 mL | ORAL_SOLUTION | Freq: Three times a day (TID) | ORAL | 0 refills | Status: DC | PRN
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
## 2020-09-11 NOTE — MAU Note (Signed)
Having a hard time breathing, feels like there is a lot of mucous, congestion in her chest.  Wheezing, sinus drainage.  Started yesterday morning.  Has had multiple nose bleeds the past couple days. Low grade fever, highest 100.3, 7pm last night.  Has not taken any medication, Tylenol- nothing. No real cough, just when she tries to clear her throat. Denies being around anyone sick

## 2020-09-11 NOTE — Discharge Instructions (Signed)

## 2020-09-12 ENCOUNTER — Telehealth (HOSPITAL_COMMUNITY): Payer: Self-pay

## 2020-09-12 ENCOUNTER — Telehealth: Payer: Self-pay | Admitting: Infectious Diseases

## 2020-09-12 DIAGNOSIS — U071 COVID-19: Secondary | ICD-10-CM

## 2020-09-12 MED ORDER — NIRMATRELVIR/RITONAVIR (PAXLOVID)TABLET
3.0000 | ORAL_TABLET | Freq: Two times a day (BID) | ORAL | 0 refills | Status: AC
Start: 2020-09-12 — End: 2020-09-17
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
## 2020-09-12 NOTE — Telephone Encounter (Signed)
Called to discuss with patient about COVID-19 symptoms and the use of one of the available treatments for those with mild to moderate Covid symptoms and at a high risk of hospitalization.  Pt appears to qualify for outpatient treatment due to co-morbid conditions and/or a member of an at-risk group in accordance with the FDA Emergency Use Authorization.    Symptom onset: Friday 09/08/20, pt c/o low grade fever, SOB, running nose, cough, congestion Vaccinated: Yes Booster: No Immunocompromised: No Qualifiers: currently pregnant, BMI>25 NIH Criteria: Tier 4  Pt is interested in treatment. RN informed pt that an APP will call back to discuss any available options.   Essie Hart, RN

## 2020-09-27 ENCOUNTER — Other Ambulatory Visit: Payer: Self-pay

## 2020-09-27 ENCOUNTER — Ambulatory Visit (INDEPENDENT_AMBULATORY_CARE_PROVIDER_SITE_OTHER): Payer: Medicaid Other | Admitting: Family Medicine

## 2020-09-27 VITALS — BP 120/70 | HR 93 | Wt 273.0 lb

## 2020-09-27 DIAGNOSIS — O09899 Supervision of other high risk pregnancies, unspecified trimester: Secondary | ICD-10-CM

## 2020-09-27 DIAGNOSIS — N96 Recurrent pregnancy loss: Secondary | ICD-10-CM

## 2020-09-27 DIAGNOSIS — Z3A22 22 weeks gestation of pregnancy: Secondary | ICD-10-CM

## 2020-09-27 MED ORDER — TRIAMCINOLONE ACETONIDE 0.5 % EX CREA: 1.0000 "application " | TOPICAL_CREAM | Freq: Three times a day (TID) | CUTANEOUS | 1 refills | Status: DC

## 2020-09-27 NOTE — Progress Notes (Signed)
   PRENATAL VISIT NOTE  Subjective:  Gina Lucas is a 25 y.o. G4P0030 at [redacted]w[redacted]d being seen today for ongoing prenatal care.  She is currently monitored for the following issues for this low-risk pregnancy and has Grief associated with loss of fetus; Supervision of other high risk pregnancy, antepartum, unspecified trimester; and History of recurrent miscarriages on their problem list.  Patient reports itching between fingers and a couple of spots on arm and foot..  Contractions: Not present. Vag. Bleeding: None.  Movement: Present. Denies leaking of fluid.   The following portions of the patient's history were reviewed and updated as appropriate: allergies, current medications, past family history, past medical history, past social history, past surgical history and problem list.   Objective:   Vitals:   09/27/20 1609  BP: 120/70  Pulse: 93  Weight: 273 lb (123.8 kg)    Fetal Status:     Movement: Present     General:  Alert, oriented and cooperative. Patient is in no acute distress.  Skin: Skin is warm and dry. No rash noted. No pustules. Dry rash area on left forearm.  Cardiovascular: Normal heart rate noted  Respiratory: Normal respiratory effort, no problems with respiration noted  Abdomen: Soft, gravid, appropriate for gestational age.  Pain/Pressure: Present     Pelvic: Cervical exam deferred        Extremities: Normal range of motion.  Edema: Trace  Mental Status: Normal mood and affect. Normal behavior. Normal judgment and thought content.   Assessment and Plan:  Pregnancy: G4P0030 at [redacted]w[redacted]d 1. [redacted] weeks gestation of pregnancy  2. Supervision of other high risk pregnancy, antepartum, unspecified trimester FHT and FH normal. Triamcinolone for itching areas.  3. History of recurrent miscarriages  Preterm labor symptoms and general obstetric precautions including but not limited to vaginal bleeding, contractions, leaking of fluid and fetal movement were reviewed in detail  with the patient. Please refer to After Visit Summary for other counseling recommendations.   No follow-ups on file.  Future Appointments  Date Time Provider Department Center  10/09/2020  7:45 AM [facility] NURSE [facility]  10/09/2020  8:00 AM [facility] US1 [facility]  11/01/2020  8:30 AM Levie Heritage, DO [facility] None  11/16/2020  8:30 AM Levie Heritage, DO [facility] None    Levie Heritage, DO

## 2020-10-04 ENCOUNTER — Encounter: Payer: Medicaid Other | Admitting: Family Medicine

## 2020-10-09 ENCOUNTER — Other Ambulatory Visit: Payer: Self-pay

## 2020-10-09 ENCOUNTER — Ambulatory Visit: Payer: Medicaid Other | Attending: Obstetrics

## 2020-10-09 ENCOUNTER — Other Ambulatory Visit: Payer: Self-pay | Admitting: Obstetrics and Gynecology

## 2020-10-09 ENCOUNTER — Encounter: Payer: Self-pay | Admitting: *Deleted

## 2020-10-09 ENCOUNTER — Ambulatory Visit: Payer: Medicaid Other | Admitting: *Deleted

## 2020-10-09 DIAGNOSIS — O99212 Obesity complicating pregnancy, second trimester: Secondary | ICD-10-CM

## 2020-10-09 DIAGNOSIS — O2620 Pregnancy care for patient with recurrent pregnancy loss, unspecified trimester: Secondary | ICD-10-CM

## 2020-10-09 DIAGNOSIS — Z6838 Body mass index (BMI) 38.0-38.9, adult: Secondary | ICD-10-CM

## 2020-10-09 DIAGNOSIS — O2622 Pregnancy care for patient with recurrent pregnancy loss, second trimester: Secondary | ICD-10-CM

## 2020-10-09 DIAGNOSIS — E669 Obesity, unspecified: Secondary | ICD-10-CM

## 2020-10-09 DIAGNOSIS — Z3A24 24 weeks gestation of pregnancy: Secondary | ICD-10-CM | POA: Insufficient documentation

## 2020-10-09 DIAGNOSIS — O09899 Supervision of other high risk pregnancies, unspecified trimester: Secondary | ICD-10-CM

## 2020-10-09 DIAGNOSIS — N96 Recurrent pregnancy loss: Secondary | ICD-10-CM

## 2020-10-09 DIAGNOSIS — Z362 Encounter for other antenatal screening follow-up: Secondary | ICD-10-CM

## 2020-10-09 DIAGNOSIS — O09292 Supervision of pregnancy with other poor reproductive or obstetric history, second trimester: Secondary | ICD-10-CM | POA: Diagnosis not present

## 2020-11-01 ENCOUNTER — Other Ambulatory Visit: Payer: Self-pay

## 2020-11-01 ENCOUNTER — Ambulatory Visit (INDEPENDENT_AMBULATORY_CARE_PROVIDER_SITE_OTHER): Payer: Medicaid Other | Admitting: Family Medicine

## 2020-11-01 VITALS — BP 110/67 | HR 82 | Wt 279.0 lb

## 2020-11-01 DIAGNOSIS — Z23 Encounter for immunization: Secondary | ICD-10-CM | POA: Diagnosis not present

## 2020-11-01 DIAGNOSIS — Z3A27 27 weeks gestation of pregnancy: Secondary | ICD-10-CM

## 2020-11-01 DIAGNOSIS — O09899 Supervision of other high risk pregnancies, unspecified trimester: Secondary | ICD-10-CM

## 2020-11-01 DIAGNOSIS — U071 COVID-19: Secondary | ICD-10-CM

## 2020-11-01 DIAGNOSIS — R300 Dysuria: Secondary | ICD-10-CM | POA: Diagnosis not present

## 2020-11-01 DIAGNOSIS — O24419 Gestational diabetes mellitus in pregnancy, unspecified control: Secondary | ICD-10-CM

## 2020-11-01 DIAGNOSIS — O26899 Other specified pregnancy related conditions, unspecified trimester: Secondary | ICD-10-CM

## 2020-11-01 DIAGNOSIS — O98512 Other viral diseases complicating pregnancy, second trimester: Secondary | ICD-10-CM

## 2020-11-01 NOTE — Progress Notes (Signed)
   PRENATAL VISIT NOTE  Subjective:  Gina Lucas is a 25 y.o. G4P0030 at [redacted]w[redacted]d being seen today for ongoing prenatal care.  She is currently monitored for the following issues for this low-risk pregnancy and has Grief associated with loss of fetus; Supervision of other high risk pregnancy, antepartum, unspecified trimester; and History of recurrent miscarriages on their problem list.  Patient reports pelvic pressure.  Contractions: Not present. Vag. Bleeding: None.  Movement: Present. Denies leaking of fluid.   The following portions of the patient's history were reviewed and updated as appropriate: allergies, current medications, past family history, past medical history, past social history, past surgical history and problem list.   Objective:   Vitals:   11/01/20 0828  BP: 110/67  Pulse: 82  Weight: 279 lb (126.6 kg)    Fetal Status: Fetal Heart Rate (bpm): 142 (Simultaneous filing. User may not have seen previous data.) Fundal Height: 29 cm Movement: Present     General:  Alert, oriented and cooperative. Patient is in no acute distress.  Skin: Skin is warm and dry. No rash noted.   Cardiovascular: Normal heart rate noted  Respiratory: Normal respiratory effort, no problems with respiration noted  Abdomen: Soft, gravid, appropriate for gestational age.  Pain/Pressure: Present     Pelvic: Cervical exam deferred        Extremities: Normal range of motion.  Edema: Trace  Mental Status: Normal mood and affect. Normal behavior. Normal judgment and thought content.   Assessment and Plan:  Pregnancy: G4P0030 at [redacted]w[redacted]d 1. [redacted] weeks gestation of pregnancy  2. Supervision of other high risk pregnancy, antepartum, unspecified trimester FHT normal - CBC - RPR - HIV Antibody (routine testing w rflx) - Glucose Tolerance, 2 Hours w/1 Hour  3. Dysuria during pregnancy, antepartum Urine culture - Urine Culture  4. COVID-19 affecting pregnancy in second trimester At 20 weeks  Preterm  labor symptoms and general obstetric precautions including but not limited to vaginal bleeding, contractions, leaking of fluid and fetal movement were reviewed in detail with the patient. Please refer to After Visit Summary for other counseling recommendations.   No follow-ups on file.  Future Appointments  Date Time Provider Department Center  11/16/2020  8:30 AM Levie Heritage, DO [facility] None  12/04/2020  7:45 AM [facility] NURSE [facility]  12/04/2020  8:00 AM [facility] US1 [facility]    Levie Heritage, DO

## 2020-11-01 NOTE — Progress Notes (Signed)
+   Fetal movement. Pt c/o increased pelvic pressure. PHQ 9: 4, GAD 7: 1

## 2020-11-02 DIAGNOSIS — U071 COVID-19: Secondary | ICD-10-CM | POA: Insufficient documentation

## 2020-11-02 DIAGNOSIS — O98512 Other viral diseases complicating pregnancy, second trimester: Secondary | ICD-10-CM | POA: Insufficient documentation

## 2020-11-02 DIAGNOSIS — O24419 Gestational diabetes mellitus in pregnancy, unspecified control: Secondary | ICD-10-CM | POA: Insufficient documentation

## 2020-11-02 LAB — CBC
Hematocrit: 36.8 % (ref 34.0–46.6)
Hemoglobin: 12 g/dL (ref 11.1–15.9)
MCH: 29.5 pg (ref 26.6–33.0)
MCHC: 32.6 g/dL (ref 31.5–35.7)
MCV: 90 fL (ref 79–97)
Platelets: 178 10*3/uL (ref 150–450)
RBC: 4.07 x10E6/uL (ref 3.77–5.28)
RDW: 12.5 % (ref 11.7–15.4)
WBC: 9.2 10*3/uL (ref 3.4–10.8)

## 2020-11-02 LAB — GLUCOSE TOLERANCE, 2 HOURS W/ 1HR
Glucose, 1 hour: 227 mg/dL — ABNORMAL HIGH (ref 65–179)
Glucose, 2 hour: 161 mg/dL — ABNORMAL HIGH (ref 65–152)
Glucose, Fasting: 121 mg/dL — ABNORMAL HIGH (ref 65–91)

## 2020-11-02 LAB — HIV ANTIBODY (ROUTINE TESTING W REFLEX): HIV Screen 4th Generation wRfx: NONREACTIVE

## 2020-11-02 LAB — RPR: RPR Ser Ql: NONREACTIVE

## 2020-11-02 MED ORDER — ACCU-CHEK SMARTVIEW VI STRP: ORAL_STRIP | 12 refills | Status: DC

## 2020-11-02 MED ORDER — ACCU-CHEK NANO SMARTVIEW W/DEVICE KIT: 1.0000 | PACK | 0 refills | Status: DC

## 2020-11-02 MED ORDER — ACCU-CHEK SOFTCLIX LANCETS MISC: 1.0000 | Freq: Four times a day (QID) | 12 refills | Status: DC

## 2020-11-02 NOTE — Addendum Note (Signed)
Addended by: Levie Heritage on: 11/02/2020 10:56 AM   Modules accepted: Orders

## 2020-11-06 LAB — URINE CULTURE

## 2020-11-08 ENCOUNTER — Encounter: Payer: Medicaid Other | Attending: Family Medicine | Admitting: Registered"

## 2020-11-08 ENCOUNTER — Telehealth: Payer: Self-pay

## 2020-11-08 ENCOUNTER — Other Ambulatory Visit: Payer: Self-pay

## 2020-11-08 DIAGNOSIS — O24419 Gestational diabetes mellitus in pregnancy, unspecified control: Secondary | ICD-10-CM | POA: Diagnosis not present

## 2020-11-08 NOTE — Telephone Encounter (Signed)
Pt called stating she thinks she's having braxton hicks contractions. She states she has been having contractions since yesterday. She denies leakage of fluids and bleeding. I advised pt to increase her fluids and if the contractions persists she should go to [facility].Understanding was voiced. Korah Hufstedler l Cordon Gassett, CMA

## 2020-11-09 ENCOUNTER — Encounter: Payer: Self-pay | Admitting: Registered"

## 2020-11-09 NOTE — Progress Notes (Signed)
Patient was seen on 11/08/20 for Gestational Diabetes self-management class at [facility]. The following learning objectives were met by the patient during this course:  States the definition of Gestational Diabetes States why dietary management is important in controlling blood glucose Describes the effects each nutrient has on blood glucose levels Demonstrates ability to create a balanced meal plan Demonstrates carbohydrate counting  States when to check blood glucose levels Demonstrates proper blood glucose monitoring techniques States the effect of stress and exercise on blood glucose levels States the importance of limiting caffeine and abstaining from alcohol and smoking  Blood glucose monitor given: Patient has meter and is checking blood sugar prior to class   Patient instructed to monitor glucose levels: FBS: 60 - <95; 1 hour: <140; 2 hour: <120  Patient received handouts: Nutrition Diabetes and Pregnancy, including carb counting list  Patient will be seen for follow-up as needed.

## 2020-11-09 NOTE — Progress Notes (Signed)
Chart reviewed - agree with CMA/RN documentation.  ° °

## 2020-11-16 ENCOUNTER — Ambulatory Visit (INDEPENDENT_AMBULATORY_CARE_PROVIDER_SITE_OTHER): Payer: Medicaid Other | Admitting: Family Medicine

## 2020-11-16 ENCOUNTER — Other Ambulatory Visit: Payer: Self-pay

## 2020-11-16 VITALS — BP 114/68 | HR 86 | Wt 280.0 lb

## 2020-11-16 DIAGNOSIS — O24419 Gestational diabetes mellitus in pregnancy, unspecified control: Secondary | ICD-10-CM

## 2020-11-16 DIAGNOSIS — U071 COVID-19: Secondary | ICD-10-CM

## 2020-11-16 DIAGNOSIS — N96 Recurrent pregnancy loss: Secondary | ICD-10-CM

## 2020-11-16 DIAGNOSIS — O98512 Other viral diseases complicating pregnancy, second trimester: Secondary | ICD-10-CM

## 2020-11-16 DIAGNOSIS — O09899 Supervision of other high risk pregnancies, unspecified trimester: Secondary | ICD-10-CM

## 2020-11-16 DIAGNOSIS — Z3A29 29 weeks gestation of pregnancy: Secondary | ICD-10-CM

## 2020-11-16 LAB — POCT URINALYSIS DIPSTICK OB
Bilirubin, UA: NEGATIVE
Blood, UA: NEGATIVE
Glucose, UA: NEGATIVE
Nitrite, UA: NEGATIVE
POC,PROTEIN,UA: NEGATIVE
Spec Grav, UA: 1.015 (ref 1.010–1.025)
Urobilinogen, UA: 0.2 E.U./dL
pH, UA: 6.5 (ref 5.0–8.0)

## 2020-11-16 MED ORDER — INSULIN ASPART FLEXPEN 100 UNIT/ML ~~LOC~~ SOPN: 10.0000 [IU] | PEN_INJECTOR | Freq: Three times a day (TID) | SUBCUTANEOUS | 11 refills | Status: DC

## 2020-11-16 MED ORDER — INSULIN PEN NEEDLE 31G X 5 MM MISC: 1.0000 [IU] | Freq: Every day | 6 refills | Status: DC

## 2020-11-16 MED ORDER — LEVEMIR FLEXTOUCH 100 UNIT/ML ~~LOC~~ SOPN: 15.0000 [IU] | PEN_INJECTOR | Freq: Two times a day (BID) | SUBCUTANEOUS | 11 refills | Status: DC

## 2020-11-16 NOTE — Progress Notes (Signed)
Subjective:  Gina Lucas is a 25 y.o. G4P0030 at [redacted]w[redacted]d being seen today for ongoing prenatal care.  She is currently monitored for the following issues for this high-risk pregnancy and has Grief associated with loss of fetus; Supervision of other high risk pregnancy, antepartum, unspecified trimester; History of recurrent miscarriages; Gestational diabetes mellitus (GDM) in third trimester; and COVID-19 affecting pregnancy in second trimester on their problem list.  GDM: Patient diet controlled.  Reports no hypoglycemic episodes.  Tolerating medication well Fasting: elevated between 110-130 2hr PP: some normal, but mostly 150-180  Patient reports no complaints.  Contractions: Irritability. Vag. Bleeding: None.  Movement: Present. Denies leaking of fluid.   The following portions of the patient's history were reviewed and updated as appropriate: allergies, current medications, past family history, past medical history, past social history, past surgical history and problem list. Problem list updated.  Objective:   Vitals:   11/16/20 0840  BP: 114/68  Pulse: 86  Weight: 280 lb (127 kg)    Fetal Status: Fetal Heart Rate (bpm): 131   Movement: Present     General:  Alert, oriented and cooperative. Patient is in no acute distress.  Skin: Skin is warm and dry. No rash noted.   Cardiovascular: Normal heart rate noted  Respiratory: Normal respiratory effort, no problems with respiration noted  Abdomen: Soft, gravid, appropriate for gestational age. Pain/Pressure: Present     Pelvic: Vag. Bleeding: None     Cervical exam deferred        Extremities: Normal range of motion.  Edema: Trace  Mental Status: Normal mood and affect. Normal behavior. Normal judgment and thought content.   Urinalysis:      Assessment and Plan:  Pregnancy: G4P0030 at [redacted]w[redacted]d  1. [redacted] weeks gestation of pregnancy - POC Urinalysis Dip[facility]  2. Supervision of other high risk pregnancy, antepartum, unspecified  trimester FHT and FH normal  3. Gestational diabetes mellitus (GDM) in third trimester, gestational diabetes method of control unspecified Start insulin: levemir 15 units BID, aspart 10 units premeal Will need to start antenatal testing at 32 weeks Delivery by 39 weeks - POC Urinalysis [facility]  4. COVID-19 affecting pregnancy in second trimester recovered  5. History of recurrent miscarriages  Preterm labor symptoms and general obstetric precautions including but not limited to vaginal bleeding, contractions, leaking of fluid and fetal movement were reviewed in detail with the patient. Please refer to After Visit Summary for other counseling recommendations.  No follow-ups on file.   Levie Heritage, DO

## 2020-11-30 ENCOUNTER — Telehealth (INDEPENDENT_AMBULATORY_CARE_PROVIDER_SITE_OTHER): Payer: Medicaid Other | Admitting: Family Medicine

## 2020-11-30 VITALS — BP 127/79 | HR 89 | Wt 285.0 lb

## 2020-11-30 DIAGNOSIS — O24419 Gestational diabetes mellitus in pregnancy, unspecified control: Secondary | ICD-10-CM

## 2020-11-30 DIAGNOSIS — N96 Recurrent pregnancy loss: Secondary | ICD-10-CM

## 2020-11-30 DIAGNOSIS — O98512 Other viral diseases complicating pregnancy, second trimester: Secondary | ICD-10-CM

## 2020-11-30 DIAGNOSIS — U071 COVID-19: Secondary | ICD-10-CM

## 2020-11-30 DIAGNOSIS — O09899 Supervision of other high risk pregnancies, unspecified trimester: Secondary | ICD-10-CM

## 2020-11-30 DIAGNOSIS — O2623 Pregnancy care for patient with recurrent pregnancy loss, third trimester: Secondary | ICD-10-CM

## 2020-11-30 DIAGNOSIS — Z3A31 31 weeks gestation of pregnancy: Secondary | ICD-10-CM

## 2020-11-30 DIAGNOSIS — O98513 Other viral diseases complicating pregnancy, third trimester: Secondary | ICD-10-CM

## 2020-11-30 MED ORDER — LEVEMIR FLEXTOUCH 100 UNIT/ML ~~LOC~~ SOPN: 30.0000 [IU] | PEN_INJECTOR | Freq: Two times a day (BID) | SUBCUTANEOUS | 11 refills | Status: DC

## 2020-11-30 NOTE — Progress Notes (Signed)
   PRENATAL VISIT NOTE  Subjective:  Gina Lucas is a 25 y.o. G4P0030 at [redacted]w[redacted]d being seen today for ongoing prenatal care.  She is currently monitored for the following issues for this high-risk pregnancy and has Grief associated with loss of fetus; Supervision of other high risk pregnancy, antepartum, unspecified trimester; History of recurrent miscarriages; Gestational diabetes mellitus (GDM) in third trimester; and COVID-19 affecting pregnancy in second trimester on their problem list.  Patient reports occasional contractions.  Contractions: Irritability. Vag. Bleeding: None.  Movement: Present. Denies leaking of fluid.   The following portions of the patient's history were reviewed and updated as appropriate: allergies, current medications, past family history, past medical history, past social history, past surgical history and problem list.   Objective:   Vitals:   11/30/20 1407  BP: 127/79  Pulse: 89  Weight: 285 lb (129.3 kg)    Fetal Status:     Movement: Present     General:  Alert, oriented and cooperative. Patient is in no acute distress.  Skin: Skin is warm and dry. No rash noted.   Cardiovascular: Normal heart rate noted  Respiratory: Normal respiratory effort, no problems with respiration noted  Abdomen: Soft, gravid, appropriate for gestational age.  Pain/Pressure: Present     Pelvic: Cervical exam deferred        Extremities: Normal range of motion.  Edema: None  Mental Status: Normal mood and affect. Normal behavior. Normal judgment and thought content.   Assessment and Plan:  Pregnancy: G4P0030 at [redacted]w[redacted]d 1. Supervision of other high risk pregnancy, antepartum, unspecified trimester FHT and FH normal  2. History of recurrent miscarriages  3. Gestational diabetes mellitus (GDM) in third trimester, gestational diabetes method of control unspecified Fastings in the 120-140 range PP between 150-70. Increase levemir to 30 units twice a day. Patient to check CBGs  over weekend and send MyChart message on Monday.  4. COVID-19 affecting pregnancy in second trimester No sequela  Preterm labor symptoms and general obstetric precautions including but not limited to vaginal bleeding, contractions, leaking of fluid and fetal movement were reviewed in detail with the patient. Please refer to After Visit Summary for other counseling recommendations.   No follow-ups on file.  Future Appointments  Date Time Provider Department Center  12/04/2020  7:45 AM [facility] NURSE [facility]  12/04/2020  8:00 AM [facility] US1 [facility]  12/14/2020  9:00 AM Constant, Peggy, MD [facility] None  12/28/2020  9:15 AM Levie Heritage, DO [facility] None  01/04/2021  9:35 AM Levie Heritage, DO [facility] None  01/11/2021  9:35 AM Levie Heritage, DO [facility] None  01/18/2021  9:35 AM Adrian Blackwater Rhona Raider, DO [facility] None    Levie Heritage, DO

## 2020-12-04 ENCOUNTER — Other Ambulatory Visit: Payer: Self-pay

## 2020-12-04 ENCOUNTER — Other Ambulatory Visit: Payer: Self-pay | Admitting: Obstetrics and Gynecology

## 2020-12-04 ENCOUNTER — Other Ambulatory Visit: Payer: Self-pay | Admitting: *Deleted

## 2020-12-04 ENCOUNTER — Ambulatory Visit: Payer: Medicaid Other | Admitting: *Deleted

## 2020-12-04 ENCOUNTER — Encounter: Payer: Self-pay | Admitting: *Deleted

## 2020-12-04 ENCOUNTER — Ambulatory Visit: Payer: Medicaid Other | Attending: Obstetrics and Gynecology

## 2020-12-04 VITALS — BP 117/70 | HR 84

## 2020-12-04 DIAGNOSIS — O99212 Obesity complicating pregnancy, second trimester: Secondary | ICD-10-CM

## 2020-12-04 DIAGNOSIS — N96 Recurrent pregnancy loss: Secondary | ICD-10-CM | POA: Insufficient documentation

## 2020-12-04 DIAGNOSIS — Z362 Encounter for other antenatal screening follow-up: Secondary | ICD-10-CM | POA: Diagnosis not present

## 2020-12-04 DIAGNOSIS — O2623 Pregnancy care for patient with recurrent pregnancy loss, third trimester: Secondary | ICD-10-CM

## 2020-12-04 DIAGNOSIS — O99213 Obesity complicating pregnancy, third trimester: Secondary | ICD-10-CM | POA: Diagnosis not present

## 2020-12-04 DIAGNOSIS — Z794 Long term (current) use of insulin: Secondary | ICD-10-CM | POA: Diagnosis not present

## 2020-12-04 DIAGNOSIS — E669 Obesity, unspecified: Secondary | ICD-10-CM | POA: Diagnosis not present

## 2020-12-04 DIAGNOSIS — Z3A32 32 weeks gestation of pregnancy: Secondary | ICD-10-CM

## 2020-12-04 DIAGNOSIS — O2620 Pregnancy care for patient with recurrent pregnancy loss, unspecified trimester: Secondary | ICD-10-CM | POA: Diagnosis not present

## 2020-12-04 DIAGNOSIS — O09899 Supervision of other high risk pregnancies, unspecified trimester: Secondary | ICD-10-CM

## 2020-12-04 DIAGNOSIS — O24414 Gestational diabetes mellitus in pregnancy, insulin controlled: Secondary | ICD-10-CM

## 2020-12-05 MED ORDER — LEVEMIR FLEXTOUCH 100 UNIT/ML ~~LOC~~ SOPN: 30.0000 [IU] | PEN_INJECTOR | Freq: Two times a day (BID) | SUBCUTANEOUS | 11 refills | Status: DC

## 2020-12-05 MED ORDER — INSULIN ASPART FLEXPEN 100 UNIT/ML ~~LOC~~ SOPN: 15.0000 [IU] | PEN_INJECTOR | Freq: Three times a day (TID) | SUBCUTANEOUS | 11 refills | Status: DC

## 2020-12-11 ENCOUNTER — Ambulatory Visit: Payer: Medicaid Other | Admitting: *Deleted

## 2020-12-11 ENCOUNTER — Other Ambulatory Visit: Payer: Self-pay

## 2020-12-11 ENCOUNTER — Other Ambulatory Visit: Payer: Self-pay | Admitting: Family Medicine

## 2020-12-11 ENCOUNTER — Telehealth: Payer: Self-pay

## 2020-12-11 ENCOUNTER — Ambulatory Visit (INDEPENDENT_AMBULATORY_CARE_PROVIDER_SITE_OTHER): Payer: Medicaid Other

## 2020-12-11 VITALS — BP 117/70 | HR 99 | Wt 285.1 lb

## 2020-12-11 DIAGNOSIS — O24414 Gestational diabetes mellitus in pregnancy, insulin controlled: Secondary | ICD-10-CM | POA: Diagnosis not present

## 2020-12-11 MED ORDER — INSULIN ASPART FLEXPEN 100 UNIT/ML ~~LOC~~ SOPN: 15.0000 [IU] | PEN_INJECTOR | Freq: Three times a day (TID) | SUBCUTANEOUS | 3 refills | Status: DC

## 2020-12-11 NOTE — Progress Notes (Signed)

## 2020-12-11 NOTE — Progress Notes (Signed)
  NST:  Baseline: 125 bpm, Variability: Good {> 6 bpm), Accelerations: Reactive, and Decelerations: Absent  

## 2020-12-11 NOTE — Telephone Encounter (Signed)
Talked to pt about Insulin Aspart Flexpen. Pt states she will have to pick up Flex pen every 5 days due to the quantity.  Spoke the pharmacist about pt's Aspart Flexpen. The pharmacist states the Aspart Flexpen isn't covered but the brand name Novolog is covered through pt's insurance she also states that the Rx will need to be changed from 68ml because that quantity will last the patient for 5 days only. A message will be sent to the provider.Kellene Mccleary l Lieutenant Abarca, CMA

## 2020-12-11 NOTE — Telephone Encounter (Signed)
-----   Message from Marti Sleigh, [place] sent at 12/11/2020  9:50 AM EDT ----- Regarding: Meds Please call patient in regards to her medication.  The pharmacy is only refilling one pen.  She can explain more when you call.  Thanks

## 2020-12-14 ENCOUNTER — Encounter: Payer: Self-pay | Admitting: Obstetrics and Gynecology

## 2020-12-14 ENCOUNTER — Other Ambulatory Visit: Payer: Self-pay

## 2020-12-14 ENCOUNTER — Ambulatory Visit (INDEPENDENT_AMBULATORY_CARE_PROVIDER_SITE_OTHER): Payer: Medicaid Other | Admitting: Obstetrics and Gynecology

## 2020-12-14 VITALS — BP 124/62 | HR 88 | Wt 285.4 lb

## 2020-12-14 DIAGNOSIS — Z3A33 33 weeks gestation of pregnancy: Secondary | ICD-10-CM

## 2020-12-14 DIAGNOSIS — O09899 Supervision of other high risk pregnancies, unspecified trimester: Secondary | ICD-10-CM

## 2020-12-14 DIAGNOSIS — O24414 Gestational diabetes mellitus in pregnancy, insulin controlled: Secondary | ICD-10-CM

## 2020-12-14 MED ORDER — SERTRALINE HCL 25 MG PO TABS: 25.0000 mg | ORAL_TABLET | Freq: Every day | ORAL | 1 refills | Status: DC

## 2020-12-14 NOTE — Progress Notes (Signed)
   PRENATAL VISIT NOTE  Subjective:  Gina Lucas is a 25 y.o. G4P0030 at [redacted]w[redacted]d being seen today for ongoing prenatal care.  She is currently monitored for the following issues for this high-risk pregnancy and has Grief associated with loss of fetus; Supervision of other high risk pregnancy, antepartum, unspecified trimester; History of recurrent miscarriages; Gestational diabetes mellitus (GDM) in third trimester; and COVID-19 affecting pregnancy in second trimester on their problem list.  Patient reports feeling depressed and is scheduled to meet with a therapist tomorrow.  Contractions: Irritability. Vag. Bleeding: None.  Movement: Present. Denies leaking of fluid.   The following portions of the patient's history were reviewed and updated as appropriate: allergies, current medications, past family history, past medical history, past social history, past surgical history and problem list.   Objective:   Vitals:   12/14/20 0906  BP: 124/62  Pulse: 88  Weight: 285 lb 6.4 oz (129.5 kg)    Fetal Status: Fetal Heart Rate (bpm): 143 Fundal Height: 35 cm Movement: Present     General:  Alert, oriented and cooperative. Patient is in no acute distress.  Skin: Skin is warm and dry. No rash noted.   Cardiovascular: Normal heart rate noted  Respiratory: Normal respiratory effort, no problems with respiration noted  Abdomen: Soft, gravid, appropriate for gestational age.  Pain/Pressure: Absent     Pelvic: Cervical exam deferred        Extremities: Normal range of motion.  Edema: Trace  Mental Status: Normal mood and affect. Normal behavior. Normal judgment and thought content.   Assessment and Plan:  Pregnancy: G4P0030 at [redacted]w[redacted]d 1. Supervision of other high risk pregnancy, antepartum, unspecified trimester Patient is doing well with depressive symptoms without suicidal/homicidal ideations - Rx zoloft provided   2. Insulin controlled gestational diabetes mellitus (GDM) in third  trimester CBGs reviewed and fasting have improved with a max value of 102. Postprandial value within range with the exception of a few dietary indiscretions Will continue with current insulin dosage Patient also admits to having a difficult time refilling her novolog which she was finally able to get yesterday Continue weekly antenatal testing and growth ultrasounds  Preterm labor symptoms and general obstetric precautions including but not limited to vaginal bleeding, contractions, leaking of fluid and fetal movement were reviewed in detail with the patient. Please refer to After Visit Summary for other counseling recommendations.   No follow-ups on file.  Future Appointments  Date Time Provider Department Center  12/19/2020  8:15 AM [facility] NST [facility]  12/26/2020  8:15 AM [facility] NST [facility]  12/28/2020  9:15 AM Levie Heritage, DO [facility] None  01/02/2021  7:45 AM [facility] NURSE [facility]  01/02/2021  8:00 AM [facility] US1 [facility]  01/04/2021  9:35 AM Levie Heritage, DO [facility] None  01/10/2021  8:15 AM [facility] NST [facility]  01/11/2021  9:35 AM Levie Heritage, DO [facility] None  01/15/2021  8:15 AM [facility] NST [facility]  01/18/2021  9:35 AM Stinson, Rhona Raider, DO [facility] None    Catalina Antigua, MD

## 2020-12-19 ENCOUNTER — Ambulatory Visit: Payer: Medicaid Other | Admitting: *Deleted

## 2020-12-19 ENCOUNTER — Ambulatory Visit (INDEPENDENT_AMBULATORY_CARE_PROVIDER_SITE_OTHER): Payer: Medicaid Other

## 2020-12-19 ENCOUNTER — Other Ambulatory Visit: Payer: Self-pay

## 2020-12-19 DIAGNOSIS — O24414 Gestational diabetes mellitus in pregnancy, insulin controlled: Secondary | ICD-10-CM

## 2020-12-22 ENCOUNTER — Other Ambulatory Visit: Payer: Self-pay

## 2020-12-22 ENCOUNTER — Encounter (HOSPITAL_COMMUNITY): Payer: Self-pay | Admitting: Obstetrics and Gynecology

## 2020-12-22 ENCOUNTER — Inpatient Hospital Stay (HOSPITAL_COMMUNITY)
Admission: AD | Admit: 2020-12-22 | Discharge: 2020-12-22 | Disposition: A | Discharge status: Home / Self Care | Payer: Medicaid Other | Attending: Obstetrics and Gynecology | Admitting: Obstetrics and Gynecology

## 2020-12-22 DIAGNOSIS — Z0371 Encounter for suspected problem with amniotic cavity and membrane ruled out: Secondary | ICD-10-CM

## 2020-12-22 DIAGNOSIS — Z3A35 35 weeks gestation of pregnancy: Secondary | ICD-10-CM | POA: Insufficient documentation

## 2020-12-22 HISTORY — DX: Gestational diabetes mellitus in pregnancy, unspecified control: O24.419

## 2020-12-22 HISTORY — DX: Depression, unspecified: F32.A

## 2020-12-22 LAB — POCT FERN TEST: POCT Fern Test: NEGATIVE

## 2020-12-22 NOTE — MAU Provider Note (Signed)
Event Date/Time   First Provider Initiated Contact with Patient 12/22/20 1424       S: Ms. Gina Lucas is a 25 y.o. G4P0030 at [redacted]w[redacted]d  who presents to MAU today complaining of leaking of fluid since this morning. She denies vaginal bleeding. She denies contractions. She reports normal fetal movement.    O: BP 126/75 (BP Location: Right Arm)   Pulse 94   Temp 98.4 F (36.9 C) (Oral)   Resp 18   Ht 5\' 6"  (1.676 m)   Wt 130.7 kg   LMP 04/21/2020 (Exact Date)   SpO2 98%   BMI 46.52 kg/m  GENERAL: Well-developed, well-nourished female in no acute distress.  HEAD: Normocephalic, atraumatic.  CHEST: Normal effort of breathing, regular heart rate ABDOMEN: Soft, nontender, gravid PELVIC: Normal external female genitalia. Vagina is pink and rugated. Cervix with normal contour, no lesions. Normal discharge.  No pooling.   Cervical exam: not indicated     Fetal Monitoring: Baseline: 140 Variability: moderate Accelerations: 15x15 Decelerations: none Contractions: none  Results for orders placed or performed during the hospital encounter of 12/22/20 (from the past 24 hour(s))  POCT fern test     Status: None   Collection Time: 12/22/20  2:31 PM  Result Value Ref Range   POCT Fern Test Negative = intact amniotic membranes      A: SIUP at [redacted]w[redacted]d  Membranes intact  P: Discharge home Keep f/u with ob Labor precautions  [redacted]w[redacted]d, NP 12/22/2020 2:24 PM

## 2020-12-22 NOTE — MAU Note (Signed)
This morning had gone to the restroom, around 0830, felt really wet.  Had intercourse- so thought it wasn't unusual.  Around 1200, noted a large wet spot in clothes.  Called advice line, instructed to come in. No gushes, no bleeding, some discomfort, nothing regular.

## 2020-12-26 ENCOUNTER — Ambulatory Visit (INDEPENDENT_AMBULATORY_CARE_PROVIDER_SITE_OTHER): Payer: Medicaid Other | Admitting: General Practice

## 2020-12-26 ENCOUNTER — Ambulatory Visit (INDEPENDENT_AMBULATORY_CARE_PROVIDER_SITE_OTHER): Payer: Medicaid Other

## 2020-12-26 ENCOUNTER — Other Ambulatory Visit: Payer: Self-pay

## 2020-12-26 VITALS — BP 118/74 | HR 82

## 2020-12-26 DIAGNOSIS — O24414 Gestational diabetes mellitus in pregnancy, insulin controlled: Secondary | ICD-10-CM

## 2020-12-26 NOTE — Progress Notes (Signed)
Pt informed that the ultrasound is considered a limited OB ultrasound and is not intended to be a complete ultrasound exam.  Patient also informed that the ultrasound is not being completed with the intent of assessing for fetal or placental anomalies or any pelvic abnormalities.  Explained that the purpose of today's ultrasound is to assess for  BPP, presentation, and AFI.  Patient acknowledges the purpose of the exam and the limitations of the study.     Latroy Gaymon H RN BSN 12/26/20  

## 2020-12-28 ENCOUNTER — Other Ambulatory Visit: Payer: Self-pay

## 2020-12-28 ENCOUNTER — Ambulatory Visit (INDEPENDENT_AMBULATORY_CARE_PROVIDER_SITE_OTHER): Payer: Medicaid Other | Admitting: Family Medicine

## 2020-12-28 ENCOUNTER — Other Ambulatory Visit (HOSPITAL_COMMUNITY)
Admission: RE | Admit: 2020-12-28 | Discharge: 2020-12-28 | Disposition: A | Discharge status: Home / Self Care | Payer: Medicaid Other | Source: Ambulatory Visit | Attending: Family Medicine | Admitting: Family Medicine

## 2020-12-28 VITALS — BP 121/70 | HR 88 | Wt 291.0 lb

## 2020-12-28 DIAGNOSIS — O09899 Supervision of other high risk pregnancies, unspecified trimester: Secondary | ICD-10-CM | POA: Insufficient documentation

## 2020-12-28 DIAGNOSIS — Z3A35 35 weeks gestation of pregnancy: Secondary | ICD-10-CM

## 2020-12-28 DIAGNOSIS — F419 Anxiety disorder, unspecified: Secondary | ICD-10-CM

## 2020-12-28 DIAGNOSIS — N96 Recurrent pregnancy loss: Secondary | ICD-10-CM

## 2020-12-28 DIAGNOSIS — O24414 Gestational diabetes mellitus in pregnancy, insulin controlled: Secondary | ICD-10-CM

## 2020-12-28 MED ORDER — LEVEMIR FLEXTOUCH 100 UNIT/ML ~~LOC~~ SOPN: 40.0000 [IU] | PEN_INJECTOR | Freq: Two times a day (BID) | SUBCUTANEOUS | 11 refills | Status: DC

## 2020-12-28 MED ORDER — INSULIN ASPART FLEXPEN 100 UNIT/ML ~~LOC~~ SOPN: 20.0000 [IU] | PEN_INJECTOR | Freq: Three times a day (TID) | SUBCUTANEOUS | 3 refills | Status: DC

## 2020-12-28 NOTE — Progress Notes (Signed)
Patient presents for ob visit and 36 week cultures.  PHQ9 score 11 and GAD score 14- referral to [facility]let placed. Armandina Stammer RN
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
## 2020-12-29 LAB — GC/CHLAMYDIA PROBE AMP (~~LOC~~) NOT AT ARMC
Chlamydia: NEGATIVE
Comment: NEGATIVE
Comment: NORMAL
Neisseria Gonorrhea: NEGATIVE

## 2021-01-01 LAB — CULTURE, BETA STREP (GROUP B ONLY): Strep Gp B Culture: NEGATIVE

## 2021-01-02 ENCOUNTER — Ambulatory Visit: Payer: Medicaid Other | Admitting: *Deleted

## 2021-01-02 ENCOUNTER — Encounter: Payer: Self-pay | Admitting: *Deleted

## 2021-01-02 ENCOUNTER — Other Ambulatory Visit: Payer: Self-pay

## 2021-01-02 ENCOUNTER — Ambulatory Visit: Payer: Medicaid Other | Attending: Obstetrics and Gynecology

## 2021-01-02 VITALS — BP 116/62 | HR 86

## 2021-01-02 DIAGNOSIS — Z362 Encounter for other antenatal screening follow-up: Secondary | ICD-10-CM | POA: Diagnosis not present

## 2021-01-02 DIAGNOSIS — Z794 Long term (current) use of insulin: Secondary | ICD-10-CM

## 2021-01-02 DIAGNOSIS — Z3A36 36 weeks gestation of pregnancy: Secondary | ICD-10-CM

## 2021-01-02 DIAGNOSIS — O2623 Pregnancy care for patient with recurrent pregnancy loss, third trimester: Secondary | ICD-10-CM

## 2021-01-02 DIAGNOSIS — E669 Obesity, unspecified: Secondary | ICD-10-CM | POA: Diagnosis not present

## 2021-01-02 DIAGNOSIS — O99213 Obesity complicating pregnancy, third trimester: Secondary | ICD-10-CM | POA: Diagnosis not present

## 2021-01-02 DIAGNOSIS — O09899 Supervision of other high risk pregnancies, unspecified trimester: Secondary | ICD-10-CM | POA: Insufficient documentation

## 2021-01-02 DIAGNOSIS — O24414 Gestational diabetes mellitus in pregnancy, insulin controlled: Secondary | ICD-10-CM | POA: Insufficient documentation

## 2021-01-02 DIAGNOSIS — N96 Recurrent pregnancy loss: Secondary | ICD-10-CM | POA: Insufficient documentation

## 2021-01-04 ENCOUNTER — Ambulatory Visit (INDEPENDENT_AMBULATORY_CARE_PROVIDER_SITE_OTHER): Payer: Medicaid Other | Admitting: Family Medicine

## 2021-01-04 ENCOUNTER — Other Ambulatory Visit: Payer: Self-pay

## 2021-01-04 VITALS — BP 122/81 | HR 76 | Wt 294.0 lb

## 2021-01-04 DIAGNOSIS — O98512 Other viral diseases complicating pregnancy, second trimester: Secondary | ICD-10-CM

## 2021-01-04 DIAGNOSIS — N96 Recurrent pregnancy loss: Secondary | ICD-10-CM

## 2021-01-04 DIAGNOSIS — U071 COVID-19: Secondary | ICD-10-CM

## 2021-01-04 DIAGNOSIS — O24414 Gestational diabetes mellitus in pregnancy, insulin controlled: Secondary | ICD-10-CM

## 2021-01-04 DIAGNOSIS — O09899 Supervision of other high risk pregnancies, unspecified trimester: Secondary | ICD-10-CM
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
## 2021-01-05 ENCOUNTER — Encounter: Payer: Self-pay | Admitting: General Practice

## 2021-01-05 ENCOUNTER — Inpatient Hospital Stay (HOSPITAL_COMMUNITY)
Admission: AD | Admit: 2021-01-05 | Discharge: 2021-01-08 | DRG: 807 | Disposition: A | Discharge status: Home / Self Care | Payer: Medicaid Other | Attending: Obstetrics and Gynecology | Admitting: Obstetrics and Gynecology

## 2021-01-05 ENCOUNTER — Ambulatory Visit (INDEPENDENT_AMBULATORY_CARE_PROVIDER_SITE_OTHER): Payer: Medicaid Other | Admitting: Family Medicine

## 2021-01-05 ENCOUNTER — Other Ambulatory Visit: Payer: Self-pay

## 2021-01-05 ENCOUNTER — Encounter (HOSPITAL_COMMUNITY): Payer: Self-pay | Admitting: Obstetrics and Gynecology

## 2021-01-05 VITALS — BP 126/80 | HR 95 | Wt 290.0 lb

## 2021-01-05 DIAGNOSIS — Z87891 Personal history of nicotine dependence: Secondary | ICD-10-CM | POA: Diagnosis not present

## 2021-01-05 DIAGNOSIS — O24424 Gestational diabetes mellitus in childbirth, insulin controlled: Principal | ICD-10-CM | POA: Diagnosis present

## 2021-01-05 DIAGNOSIS — Z3A37 37 weeks gestation of pregnancy: Secondary | ICD-10-CM | POA: Diagnosis not present

## 2021-01-05 DIAGNOSIS — Z8616 Personal history of COVID-19: Secondary | ICD-10-CM

## 2021-01-05 DIAGNOSIS — D649 Anemia, unspecified: Secondary | ICD-10-CM | POA: Diagnosis not present

## 2021-01-05 DIAGNOSIS — Z3490 Encounter for supervision of normal pregnancy, unspecified, unspecified trimester: Secondary | ICD-10-CM | POA: Diagnosis present

## 2021-01-05 DIAGNOSIS — Z20822 Contact with and (suspected) exposure to covid-19: Secondary | ICD-10-CM | POA: Diagnosis not present

## 2021-01-05 DIAGNOSIS — O24429 Gestational diabetes mellitus in childbirth, unspecified control: Secondary | ICD-10-CM | POA: Diagnosis not present

## 2021-01-05 DIAGNOSIS — O24414 Gestational diabetes mellitus in pregnancy, insulin controlled: Secondary | ICD-10-CM | POA: Diagnosis not present

## 2021-01-05 DIAGNOSIS — O09899 Supervision of other high risk pregnancies, unspecified trimester: Secondary | ICD-10-CM

## 2021-01-05 DIAGNOSIS — O9902 Anemia complicating childbirth: Secondary | ICD-10-CM | POA: Diagnosis not present

## 2021-01-05 LAB — CBC
HCT: 36.4 % (ref 36.0–46.0)
Hemoglobin: 12 g/dL (ref 12.0–15.0)
MCH: 28.4 pg (ref 26.0–34.0)
MCHC: 33 g/dL (ref 30.0–36.0)
MCV: 86.3 fL (ref 80.0–100.0)
Platelets: 190 10*3/uL (ref 150–400)
RBC: 4.22 MIL/uL (ref 3.87–5.11)
RDW: 13.2 % (ref 11.5–15.5)
WBC: 10.6 10*3/uL — ABNORMAL HIGH (ref 4.0–10.5)
nRBC: 0 % (ref 0.0–0.2)

## 2021-01-05 LAB — RESP PANEL BY RT-PCR (FLU A&B, COVID) ARPGX2
Influenza A by PCR: NEGATIVE
Influenza B by PCR: NEGATIVE
SARS Coronavirus 2 by RT PCR: NEGATIVE

## 2021-01-05 LAB — GLUCOSE, CAPILLARY
Glucose-Capillary: 120 mg/dL — ABNORMAL HIGH (ref 70–99)
Glucose-Capillary: 129 mg/dL — ABNORMAL HIGH (ref 70–99)
Glucose-Capillary: 136 mg/dL — ABNORMAL HIGH (ref 70–99)
Glucose-Capillary: 138 mg/dL — ABNORMAL HIGH (ref 70–99)
Glucose-Capillary: 138 mg/dL — ABNORMAL HIGH (ref 70–99)
Glucose-Capillary: 85 mg/dL (ref 70–99)

## 2021-01-05 LAB — TYPE AND SCREEN
ABO/RH(D): O POS
Antibody Screen: NEGATIVE

## 2021-01-05 LAB — CREATININE, SERUM
Creatinine, Ser: 0.41 mg/dL — ABNORMAL LOW (ref 0.44–1.00)
GFR, Estimated: >60 mL/min (ref >60)

## 2021-01-05 MED ORDER — LACTATED RINGERS IV SOLN: 500.0000 mL | INTRAVENOUS | Status: DC | PRN

## 2021-01-05 MED ORDER — ACETAMINOPHEN 325 MG PO TABS: 650.0000 mg | ORAL_TABLET | ORAL | Status: DC | PRN

## 2021-01-05 MED ORDER — OXYTOCIN BOLUS FROM INFUSION
333.0000 mL | Freq: Once | INTRAVENOUS | Status: AC
  Administered 2021-01-06: 333 mL via INTRAVENOUS

## 2021-01-05 MED ORDER — MISOPROSTOL 50MCG HALF TABLET: 50.0000 ug | ORAL_TABLET | ORAL | Status: DC | PRN

## 2021-01-05 MED ORDER — SOD CITRATE-CITRIC ACID 500-334 MG/5ML PO SOLN: 30.0000 mL | ORAL | Status: DC | PRN

## 2021-01-05 MED ORDER — INSULIN REGULAR(HUMAN) IN NACL 100-0.9 UT/100ML-% IV SOLN
INTRAVENOUS | Status: DC
  Administered 2021-01-05: 1.7 [IU]/h via INTRAVENOUS
  Administered 2021-01-06: 1.1 [IU]/h via INTRAVENOUS
  Filled 2021-01-05: qty 100

## 2021-01-05 MED ORDER — DEXTROSE 50 % IV SOLN: 0.0000 mL | INTRAVENOUS | Status: DC | PRN

## 2021-01-05 MED ORDER — LIDOCAINE HCL (PF) 1 % IJ SOLN
30.0000 mL | INTRAMUSCULAR | Status: AC | PRN
  Administered 2021-01-06: 30 mL via SUBCUTANEOUS
  Filled 2021-01-05: qty 30

## 2021-01-05 MED ORDER — FENTANYL CITRATE (PF) 100 MCG/2ML IJ SOLN
50.0000 ug | INTRAMUSCULAR | Status: DC | PRN
  Administered 2021-01-06: 50 ug via INTRAVENOUS
  Filled 2021-01-05: qty 2

## 2021-01-05 MED ORDER — ONDANSETRON HCL 4 MG/2ML IJ SOLN: 4.0000 mg | Freq: Four times a day (QID) | INTRAMUSCULAR | Status: DC | PRN

## 2021-01-05 MED ORDER — OXYTOCIN-SODIUM CHLORIDE 30-0.9 UT/500ML-% IV SOLN
2.5000 [IU]/h | INTRAVENOUS | Status: DC
Start: 2021-01-05 — End: 2021-01-06
  Administered 2021-01-06: 2.5 [IU]/h via INTRAVENOUS
  Filled 2021-01-05: qty 500

## 2021-01-05 MED ORDER — OXYTOCIN-SODIUM CHLORIDE 30-0.9 UT/500ML-% IV SOLN
1.0000 m[IU]/min | INTRAVENOUS | Status: DC
Start: 2021-01-05 — End: 2021-01-06
  Administered 2021-01-05: 2 m[IU]/min via INTRAVENOUS
  Filled 2021-01-05: qty 500

## 2021-01-05 MED ORDER — OXYCODONE-ACETAMINOPHEN 5-325 MG PO TABS: 2.0000 | ORAL_TABLET | ORAL | Status: DC | PRN

## 2021-01-05 MED ORDER — DEXTROSE IN LACTATED RINGERS 5 % IV SOLN: INTRAVENOUS | Status: DC

## 2021-01-05 MED ORDER — TERBUTALINE SULFATE 1 MG/ML IJ SOLN
0.2500 mg | Freq: Once | INTRAMUSCULAR | Status: DC | PRN
Start: 2021-01-05 — End: 2021-01-06

## 2021-01-05 MED ORDER — LACTATED RINGERS IV SOLN: INTRAVENOUS | Status: DC

## 2021-01-05 MED ORDER — OXYCODONE-ACETAMINOPHEN 5-325 MG PO TABS: 1.0000 | ORAL_TABLET | ORAL | Status: DC | PRN

## 2021-01-05 NOTE — MAU Note (Signed)
Gina Lucas is a 25 y.o. at [redacted]w[redacted]d here in MAU reporting: had foley balloon placed this morning and states it came out around 1310, has seen some bloody mucus. Denies pain. Unsure if ROM because she has had some leaking since using the bathroom. Called the nurse line and they told her to come in.  Onset of complaint: today  Pain score: 0/10  Vitals:   01/05/21 1510  BP: 131/74  Pulse: 89  Resp: 16  Temp: 98.4 F (36.9 C)  SpO2: 96%     FHT: EFM applied in room  Lab orders placed from triage: none
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
## 2021-01-05 NOTE — Progress Notes (Signed)
Gina Lucas is a 25 y.o. G4P0030 at [redacted]w[redacted]d admitted for induction of labor due to Gestational diabetes.  Subjective: Reports she feels ok, not feeling contractions.  Objective: BP 131/80   Pulse 88   Temp 97.8 F (36.6 C) (Oral)   Resp 17   Ht 5' 6.5" (1.689 m)   Wt 131.9 kg   LMP 04/21/2020 (Exact Date)   SpO2 96%   BMI 46.23 kg/m  No intake/output data recorded. No intake/output data recorded.  FHT:  FHR: 140 bpm, variability: moderate,  accelerations:  Present,  decelerations:  Absent UC:   irregular, every 8-12 minutes SVE:   Dilation: 5 Effacement (%): 80 Station: -2 Exam by:: Dr Marisue Humble  Labs: Lab Results  Component Value Date   WBC 10.6 (H) 01/05/2021   HGB 12.0 01/05/2021   HCT 36.4 01/05/2021   MCV 86.3 01/05/2021   PLT 190 01/05/2021    Assessment / Plan: Induction of labor due to diabetes on pitocin  Labor:  s/p cooks, on pitocin. Last check was ~2 hrs ago. Pitocin at 6. Will plan for uptitration and recheck in ~ 2 hrs  Fetal Wellbeing:  Category I Pain Control:   still deciding, on the fence regarding epidural I/D:   GBS negative Anticipated MOD:  NSVD  #GDM Patient started on endotool. Last BG 138, received 2 U insulin per endotool protocol at that time. Continue on endotool  Warner Mccreedy, MD, MPH OB Fellow, Faculty Practice
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
## 2021-01-05 NOTE — MAU Provider Note (Signed)
S: Ms. Gina Lucas is a 25 y.o. G4P0030 at [redacted]w[redacted]d  who presents to MAU today complaining of leaking of fluid since 3 hours ago. She endorses vaginal bleeding. She endorses contractions/ cramping and contractions. She reports normal fetal movement.  Foley bulb fell out at home. Foley bulb was in for about 4 hours.   O: BP 131/74 (BP Location: Right Arm)   Pulse 89   Temp 98.4 F (36.9 C) (Oral)   Resp 16   LMP 04/21/2020 (Exact Date)   SpO2 96%  GENERAL: Well-developed, well-nourished female in no acute distress.  HEAD: Normocephalic, atraumatic.  CHEST: Normal effort of breathing, regular heart rate ABDOMEN: Soft, nontender, gravid PELVIC: Normal external female genitalia. Vagina is pink and rugated. Cervix with normal contour, no lesions. Normal discharge. Negative pooling.   Cervical exam:  5 cm, 80%, -2, with moderate bloody show, and bulging bag.   Fetal Monitoring: Baseline: 125 bpm Variability: Moderate  Accelerations: 15x15 Decelerations: none  Contractions: UI   A: SIUP at [redacted]w[redacted]d @ 5 cm  Intact membranes    P: Report given to RN to contact CNM for admission.   Duane Lope, NP 01/05/2021 3:43 PM

## 2021-01-05 NOTE — Progress Notes (Signed)
Labor Progress Note Gina Lucas is a 25 y.o. G4P0030 at [redacted]w[redacted]d presented for  induction of labor due to Gestational diabetes. S:   O:  BP 116/73   Pulse 77   Temp 98.6 F (37 C) (Oral)   Resp 17   Ht 5' 6.5" (1.689 m)   Wt 131.9 kg   LMP 04/21/2020 (Exact Date)   SpO2 96%   BMI 46.23 kg/m  EFM: 135/Moderate/ + Accels, - Deccels  CVE: Dilation: 6 Effacement (%): 50 Station: -2 Presentation: Vertex Exam by:: Dr Ephriam Jenkins   A&P: 25 y.o. G4P0030 [redacted]w[redacted]d IOL for GDMA2. #Labor: Not much change since last exam.  Patient not feeling significant contractions, currently on Pitocin 14.  Will continue to up-titrate as necessary. #Pain: Epidural PRN #FWB: Cat 1 #GBS negative #GDMA2:  Continue Endotool protocol.  Jovita Kussmaul, MD 11:54 PM

## 2021-01-05 NOTE — Progress Notes (Signed)
Labor Progress Note IISHA SOYARS is a 25 y.o. G4P0030 at [redacted]w[redacted]d presented for IOL for A2GDM. S: She is not currently feeling her contractions, does endorse some blood-tinged mucous per vagina.  O:  BP 128/66   Pulse 100   Temp 97.8 F (36.6 C) (Oral)   Resp 17   Ht 5' 6.5" (1.689 m)   Wt 131.9 kg   LMP 04/21/2020 (Exact Date)   SpO2 96%   BMI 46.23 kg/m  EFM: 140/moderate variability/accelerations present  CVE: Dilation: 5 Effacement (%): 80 Station: -2 Presentation: Vertex Exam by:: Dr Marisue Humble   A&P: 25 y.o. G4P0030 [redacted]w[redacted]d here for IOL for A2GDM #Labor: Cervical check unchanged from 3 hours ago. Will start Pitocin 2x2.  #Pain: PRN, planning for epidural #FWB: Cat I #GBS negative  #A2GDM- On endotool for glycemic control   Dorothyann Gibbs, MD 6:30 PM

## 2021-01-06 ENCOUNTER — Inpatient Hospital Stay (HOSPITAL_COMMUNITY): Payer: Medicaid Other | Admitting: Anesthesiology

## 2021-01-06 ENCOUNTER — Inpatient Hospital Stay (HOSPITAL_COMMUNITY)
Admission: AD | Admit: 2021-01-06 | Payer: Medicaid Other | Source: Home / Self Care | Admitting: Obstetrics & Gynecology

## 2021-01-06 ENCOUNTER — Inpatient Hospital Stay (HOSPITAL_COMMUNITY): Payer: Medicaid Other

## 2021-01-06 DIAGNOSIS — Z3490 Encounter for supervision of normal pregnancy, unspecified, unspecified trimester: Secondary | ICD-10-CM | POA: Diagnosis present

## 2021-01-06 DIAGNOSIS — O24424 Gestational diabetes mellitus in childbirth, insulin controlled: Secondary | ICD-10-CM

## 2021-01-06 DIAGNOSIS — Z3A37 37 weeks gestation of pregnancy: Secondary | ICD-10-CM

## 2021-01-06 LAB — GLUCOSE, CAPILLARY
Glucose-Capillary: 104 mg/dL — ABNORMAL HIGH (ref 70–99)
Glucose-Capillary: 153 mg/dL — ABNORMAL HIGH (ref 70–99)
Glucose-Capillary: 136 mg/dL — ABNORMAL HIGH (ref 70–99)
Glucose-Capillary: 129 mg/dL — ABNORMAL HIGH (ref 70–99)
Glucose-Capillary: 149 mg/dL — ABNORMAL HIGH (ref 70–99)
Glucose-Capillary: 115 mg/dL — ABNORMAL HIGH (ref 70–99)
Glucose-Capillary: 124 mg/dL — ABNORMAL HIGH (ref 70–99)
Glucose-Capillary: 136 mg/dL — ABNORMAL HIGH (ref 70–99)
Glucose-Capillary: 114 mg/dL — ABNORMAL HIGH (ref 70–99)
Glucose-Capillary: 117 mg/dL — ABNORMAL HIGH (ref 70–99)
Glucose-Capillary: 124 mg/dL — ABNORMAL HIGH (ref 70–99)
Glucose-Capillary: 121 mg/dL — ABNORMAL HIGH (ref 70–99)

## 2021-01-06 LAB — RPR: RPR Ser Ql: NONREACTIVE

## 2021-01-06 MED ORDER — OXYCODONE-ACETAMINOPHEN 5-325 MG PO TABS: 2.0000 | ORAL_TABLET | ORAL | Status: DC | PRN

## 2021-01-06 MED ORDER — TETANUS-DIPHTH-ACELL PERTUSSIS 5-2.5-18.5 LF-MCG/0.5 IM SUSY: 0.5000 mL | PREFILLED_SYRINGE | Freq: Once | INTRAMUSCULAR | Status: DC

## 2021-01-06 MED ORDER — ONDANSETRON HCL 4 MG PO TABS: 4.0000 mg | ORAL_TABLET | ORAL | Status: DC | PRN

## 2021-01-06 MED ORDER — PRENATAL MULTIVITAMIN CH
1.0000 | ORAL_TABLET | Freq: Every day | ORAL | Status: DC
  Administered 2021-01-07 – 2021-01-08 (×2): 1 via ORAL
  Filled 2021-01-06 (×2): qty 1

## 2021-01-06 MED ORDER — OXYCODONE-ACETAMINOPHEN 5-325 MG PO TABS: 1.0000 | ORAL_TABLET | ORAL | Status: DC | PRN

## 2021-01-06 MED ORDER — SIMETHICONE 80 MG PO CHEW: 80.0000 mg | CHEWABLE_TABLET | ORAL | Status: DC | PRN

## 2021-01-06 MED ORDER — EPHEDRINE 5 MG/ML INJ: 10.0000 mg | INTRAVENOUS | Status: DC | PRN

## 2021-01-06 MED ORDER — IBUPROFEN 600 MG PO TABS
600.0000 mg | ORAL_TABLET | Freq: Four times a day (QID) | ORAL | Status: DC
Start: 2021-01-06 — End: 2021-01-08
  Administered 2021-01-06 – 2021-01-08 (×7): 600 mg via ORAL
  Filled 2021-01-06 (×8): qty 1

## 2021-01-06 MED ORDER — BENZOCAINE-MENTHOL 20-0.5 % EX AERO
1.0000 "application " | INHALATION_SPRAY | CUTANEOUS | Status: DC | PRN
  Administered 2021-01-06 – 2021-01-08 (×2): 1 via TOPICAL
  Filled 2021-01-06 (×2): qty 56

## 2021-01-06 MED ORDER — PHENYLEPHRINE 40 MCG/ML (10ML) SYRINGE FOR IV PUSH (FOR BLOOD PRESSURE SUPPORT): 80.0000 ug | PREFILLED_SYRINGE | INTRAVENOUS | Status: DC | PRN

## 2021-01-06 MED ORDER — MEASLES, MUMPS & RUBELLA VAC IJ SOLR: 0.5000 mL | Freq: Once | INTRAMUSCULAR | Status: DC

## 2021-01-06 MED ORDER — WITCH HAZEL-GLYCERIN EX PADS: 1.0000 "application " | MEDICATED_PAD | CUTANEOUS | Status: DC | PRN

## 2021-01-06 MED ORDER — LIDOCAINE HCL (PF) 1 % IJ SOLN
INTRAMUSCULAR | Status: DC | PRN
  Administered 2021-01-06: 8 mL via EPIDURAL

## 2021-01-06 MED ORDER — LACTATED RINGERS IV SOLN: 500.0000 mL | Freq: Once | INTRAVENOUS | Status: DC

## 2021-01-06 MED ORDER — DIPHENHYDRAMINE HCL 25 MG PO CAPS: 25.0000 mg | ORAL_CAPSULE | Freq: Four times a day (QID) | ORAL | Status: DC | PRN

## 2021-01-06 MED ORDER — FENTANYL-BUPIVACAINE-NACL 0.5-0.125-0.9 MG/250ML-% EP SOLN
12.0000 mL/h | EPIDURAL | Status: DC | PRN
  Administered 2021-01-06: 12 mL/h via EPIDURAL

## 2021-01-06 MED ORDER — PHENYLEPHRINE 40 MCG/ML (10ML) SYRINGE FOR IV PUSH (FOR BLOOD PRESSURE SUPPORT)
80.0000 ug | PREFILLED_SYRINGE | INTRAVENOUS | Status: DC | PRN
Start: 2021-01-06 — End: 2021-01-06

## 2021-01-06 MED ORDER — ACETAMINOPHEN 325 MG PO TABS: 650.0000 mg | ORAL_TABLET | ORAL | Status: DC | PRN

## 2021-01-06 MED ORDER — DIPHENHYDRAMINE HCL 50 MG/ML IJ SOLN: 12.5000 mg | INTRAMUSCULAR | Status: DC | PRN

## 2021-01-06 MED ORDER — MAGNESIUM HYDROXIDE 400 MG/5ML PO SUSP: 30.0000 mL | ORAL | Status: DC | PRN

## 2021-01-06 MED ORDER — COCONUT OIL OIL: 1.0000 "application " | TOPICAL_OIL | Status: DC | PRN

## 2021-01-06 MED ORDER — DIBUCAINE (PERIANAL) 1 % EX OINT: 1.0000 "application " | TOPICAL_OINTMENT | CUTANEOUS | Status: DC | PRN

## 2021-01-06 MED ORDER — ONDANSETRON HCL 4 MG/2ML IJ SOLN: 4.0000 mg | INTRAMUSCULAR | Status: DC | PRN

## 2021-01-06 MED ORDER — FENTANYL-BUPIVACAINE-NACL 0.5-0.125-0.9 MG/250ML-% EP SOLN
EPIDURAL | Status: AC
  Filled 2021-01-06: qty 250

## 2021-01-06 NOTE — Progress Notes (Signed)
Gina Lucas is a 25 y.o. G4P0030 at [redacted]w[redacted]d admitted for induction of labor due to Gestational diabetes.  Subjective: Feeling ok. No concerns at this time. Think she had some discharge come out when she was on the birthing ball but has not felt a gush.  Objective: BP (!) 101/52   Pulse 71   Temp 97.6 F (36.4 C) (Oral)   Resp 17   Ht 5' 6.5" (1.689 m)   Wt 131.9 kg   LMP 04/21/2020 (Exact Date)   SpO2 96%   BMI 46.23 kg/m  No intake/output data recorded. Total I/O In: 1312.4 [I.V.:1312.4] Out: -   FHT:  FHR: 140 bpm, variability: moderate,  accelerations:  Present,  decelerations:  Absent UC:   regular, every 1-3 minutes SVE:   Dilation: 6 Effacement (%): 60 Station: -1 Exam by:: Gina Jenkins MD  Labs: Lab Results  Component Value Date   WBC 10.6 (H) 01/05/2021   HGB 12.0 01/05/2021   HCT 36.4 01/05/2021   MCV 86.3 01/05/2021   PLT 190 01/05/2021    Assessment / Plan: Induction of labor due to gestational diabetes  Labor:  on pitocin, AROMed with clear fluid during this check, and IUPC placed. Continue uptitrating as tolerated Fetal Wellbeing:  Category I Pain Control:   Desires epidural I/D:   GBS negative Anticipated MOD:  NSVD  Gina Mccreedy, MD, MPH OB Fellow, Faculty Practice

## 2021-01-06 NOTE — Lactation Note (Signed)
This note was copied from a baby's chart. Lactation Consultation Note  Patient Name: Gina Lucas [id number] Date: 01/06/2021 Reason for consult: L&D Initial assessment Age:26 hours  P1, Baby cueing upon entering. Assisted latching off and on R breast. Lactation to follow up on MBU.  Maternal Data Does the patient have breastfeeding experience prior to this delivery?: No  LATCH Score Latch: Repeated attempts needed to sustain latch, nipple held in mouth throughout feeding, stimulation needed to elicit sucking reflex. (off and on)  Audible Swallowing: A few with stimulation  Type of Nipple: Everted at rest and after stimulation  Comfort (Breast/Nipple): Soft / non-tender  Hold (Positioning): Assistance needed to correctly position infant at breast and maintain latch.  LATCH Score: 7   Interventions Interventions: Assisted with latch;Skin to skin;Education  Consult Status Consult Status: Follow-up from L&D    Dahlia Byes [facility] 01/06/2021, 1:01 PM

## 2021-01-06 NOTE — Anesthesia Postprocedure Evaluation (Signed)
Anesthesia Post Note  Patient: Gina Lucas  Procedure(s) Performed: AN AD HOC LABOR EPIDURAL     Patient location during evaluation: Mother Baby Anesthesia Type: Epidural Level of consciousness: awake and alert Pain management: pain level controlled Vital Signs Assessment: post-procedure vital signs reviewed and stable Respiratory status: spontaneous breathing, nonlabored ventilation and respiratory function stable Cardiovascular status: stable Postop Assessment: no headache, no backache and epidural receding Anesthetic complications: no   No notable events documented.  Last Vitals:  Vitals:   01/06/21 1525 01/06/21 1916  BP: 121/81 118/65  Pulse: 84 92  Resp: 18 18  Temp: 36.9 C 37.4 C  SpO2:      Last Pain:  Vitals:   01/06/21 1917  TempSrc:   PainSc: 0-No pain   Pain Goal: Patients Stated Pain Goal: 0 (01/06/21 7681)                 Eloisa Chokshi

## 2021-01-06 NOTE — Anesthesia Procedure Notes (Signed)
Epidural Patient location during procedure: OB Start time: 01/06/2021 8:48 AM End time: 01/06/2021 9:52 AM  Staffing Anesthesiologist: Bethena Midget, MD  Preanesthetic Checklist Completed: patient identified, IV checked, site marked, risks and benefits discussed, surgical consent, monitors and equipment checked, pre-op evaluation and timeout performed  Epidural Patient position: sitting Prep: DuraPrep and site prepped and draped Patient monitoring: continuous pulse ox and blood pressure Approach: midline Location: L3-L4 Injection technique: LOR air  Needle:  Needle type: Tuohy  Needle gauge: 17 G Needle length: 9 cm and 9 Needle insertion depth: 9 cm Catheter type: closed end flexible Catheter size: 19 Gauge Catheter at skin depth: 15 cm Test dose: negative  Assessment Events: blood not aspirated, injection not painful, no injection resistance, no paresthesia and negative IV test

## 2021-01-06 NOTE — Progress Notes (Signed)
MOB was referred for history of depression/anxiety. * Referral screened out by Clinical Social Worker because none of the following criteria appear to apply: ~ History of anxiety/depression during this pregnancy, or of post-partum depression following prior delivery. ~ Diagnosis of anxiety and/or depression within last 3 years OR * MOB's symptoms currently being treated with medication and/or therapy. Per MOB's OB records, MOB has a counselor that she engages with for her behavioral health needs.   Please contact the Clinical Social Worker if needs arise, by MOB request, or if MOB scores greater than 9/yes to question 10 on [place] Postpartum Depression Screen.   Langley Flatley Boyd-Gilyard, MSW, LCSW Clinical Social Work [phone] 

## 2021-01-06 NOTE — Anesthesia Preprocedure Evaluation (Signed)
Anesthesia Evaluation  Patient identified by MRN, date of birth, ID band Patient awake    Reviewed: Allergy & Precautions, H&P , NPO status , Patient's Chart, lab work & pertinent test results, reviewed documented beta blocker date and time   Airway Mallampati: II  TM Distance: >3 FB Neck ROM: full    Dental no notable dental hx. (+) Teeth Intact, Dental Advisory Given   Pulmonary former smoker,    Pulmonary exam normal breath sounds clear to auscultation       Cardiovascular negative cardio ROS Normal cardiovascular exam Rhythm:regular Rate:Normal     Neuro/Psych PSYCHIATRIC DISORDERS Depression negative neurological ROS     GI/Hepatic negative GI ROS, Neg liver ROS,   Endo/Other  diabetes, Gestational  Renal/GU negative Renal ROS  negative genitourinary   Musculoskeletal   Abdominal (+) + obese,   Peds  Hematology  (+) Blood dyscrasia, anemia ,   Anesthesia Other Findings   Reproductive/Obstetrics (+) Pregnancy                             Anesthesia Physical Anesthesia Plan  ASA: 3  Anesthesia Plan: Epidural   Post-op Pain Management:    Induction:   PONV Risk Score and Plan: 2  Airway Management Planned: Natural Airway  Additional Equipment: None  Intra-op Plan:   Post-operative Plan:   Informed Consent: I have reviewed the patients History and Physical, chart, labs and discussed the procedure including the risks, benefits and alternatives for the proposed anesthesia with the patient or authorized representative who has indicated his/her understanding and acceptance.     Dental Advisory Given  Plan Discussed with: Anesthesiologist and CRNA  Anesthesia Plan Comments: (Labs checked- platelets confirmed with RN in room. Fetal heart tracing, per RN, reported to be stable enough for sitting procedure. Discussed epidural, and patient consents to the procedure:  included  risk of possible headache,backache, failed block, allergic reaction, and nerve injury. This patient was asked if she had any questions or concerns before the procedure started.)        Anesthesia Quick Evaluation

## 2021-01-06 NOTE — Discharge Summary (Signed)
Postpartum Discharge Summary     Patient Name: Gina Lucas DOB: 1996/04/18 MRN: [id number]  Date of admission: 01/05/2021 Delivery date:01/06/2021  Delivering provider: Manya Silvas  Date of discharge: 01/08/2021  Admitting diagnosis: A2GDM Intrauterine pregnancy: [redacted]w[redacted]d    Secondary diagnosis:  Active Problems:   Normal labor   Vaginal delivery   Encounter for induction of labor  Additional problems: Pre-diabetes   Discharge diagnosis: Term Pregnancy Delivered and GDM A2                                              Post partum procedures: None Augmentation: AROM, Pitocin, and OP Foley Complications: None  Hospital course: Induction of Labor With Vaginal Delivery   25y.o. yo G4P0030 at 35w1das admitted to the hospital 01/05/2021 for induction of labor.  Indication for induction: A2 DM.  Patient had an uncomplicated labor course as follows: Membrane Rupture Time/Date: 4:35 AM ,01/06/2021   Delivery Method:Vaginal, Spontaneous  Episiotomy: None  Lacerations:  Labial  Details of delivery can be found in separate delivery note.  Patient had a routine postpartum course. Patient is discharged home 01/08/21.  Newborn Data: Birth date:01/06/2021  Birth time:12:12 PM  Gender:Female  Living status:Living  Apgars:8 ,9  Weight:3476 g   Magnesium Sulfate received: No BMZ received: No Rhophylac:N/A MMR:Yes T-DaP:Given prenatally Flu: No Transfusion:No  Physical exam  Vitals:   01/07/21 0850 01/07/21 1527 01/07/21 2120 01/08/21 0550  BP: 111/68 (!) 120/53 (!) 117/53 106/68  Pulse: 80 69 71 77  Resp: '18 18 18 16  ' Temp: 98.1 F (36.7 C) 98.3 F (36.8 C) 98.1 F (36.7 C) 98.5 F (36.9 C)  TempSrc: Oral     SpO2:    100%  Weight:      Height:       General: alert, cooperative, and no distress Lochia: appropriate Uterine Fundus: firm Incision: Healing well with no significant drainage DVT Evaluation: No evidence of DVT seen on physical exam. Labs: Lab Results   Component Value Date   WBC 10.6 (H) 01/05/2021   HGB 12.0 01/05/2021   HCT 36.4 01/05/2021   MCV 86.3 01/05/2021   PLT 190 01/05/2021   CMP Latest Ref Rng & Units 01/07/2021  Glucose 70 - 99 mg/dL 125(H)  BUN 6 - 20 mg/dL -  Creatinine 0.44 - 1.00 mg/dL -  Sodium 135 - 145 mmol/L -  Potassium 3.5 - 5.1 mmol/L -  Chloride 98 - 111 mmol/L -  CO2 22 - 32 mmol/L -  Calcium 8.9 - 10.3 mg/dL -  Total Protein 6.5 - 8.1 g/dL -  Total Bilirubin 0.3 - 1.2 mg/dL -  Alkaline Phos 38 - 126 U/L -  AST 15 - 41 U/L -  ALT 0 - 44 U/L -   Edinburgh Score: Edinburgh Postnatal Depression Scale Screening Tool 01/07/2021  I have been able to laugh and see the funny side of things. 0  I have looked forward with enjoyment to things. 0  I have blamed myself unnecessarily when things went wrong. 0  I have been anxious or worried for no good reason. 0  I have felt scared or panicky for no good reason. 2  Things have been getting on top of me. 0  I have been so unhappy that I have had difficulty sleeping. 0  I have felt sad  or miserable. 0  I have been so unhappy that I have been crying. 0  The thought of harming myself has occurred to me. 0  Edinburgh Postnatal Depression Scale Total 2     After visit meds:  Allergies as of 01/08/2021       Reactions   Other Itching   Blue Cheese   Vinegar [acetic Acid] Hives        Medication List     STOP taking these medications    Accu-Chek Nano SmartView w/Device Kit   Accu-Chek SmartView test strip Generic drug: glucose blood   Accu-Chek Softclix Lancets lancets   Calcium Magnesium 750 300-300 MG Tabs Generic drug: Calcium-Magnesium   Insulin Aspart FlexPen 100 UNIT/ML Commonly known as: NOVOLOG   Insulin Pen Needle 31G X 5 MM Misc   Levemir FlexTouch 100 UNIT/ML FlexPen Generic drug: insulin detemir       TAKE these medications    acetaminophen 325 MG tablet Commonly known as: Tylenol Take 2 tablets (650 mg total) by mouth  every 4 (four) hours as needed (for pain scale < 4).   ibuprofen 200 MG tablet Commonly known as: ADVIL Take 3 tablets (600 mg total) by mouth every 6 (six) hours.   metFORMIN 500 MG tablet Commonly known as: Glucophage Take 1 tablet (500 mg total) by mouth 2 (two) times daily with a meal.   PRENATAL VITAMINS PO Take by mouth.         Discharge home in stable condition Infant Feeding: Bottle and Breast Infant Disposition:home with mother Discharge instruction: per After Visit Summary and Postpartum booklet. Activity: Advance as tolerated. Pelvic rest for 6 weeks.  Diet: carb modified diet Future Appointments: Future Appointments  Date Time Provider [place]  02/15/2021 10:35 AM Truett Mainland, DO [facility] None   Follow up Visit:   Please schedule this patient for a In person postpartum visit in 6 weeks with the following provider: Any provider. Additional Postpartum F/U:2 hour GTT  High risk pregnancy complicated by: GDM Delivery mode:  Vaginal, Spontaneous  Anticipated Birth Control:  Unsure   01/08/2021 [name], MD

## 2021-01-06 NOTE — Lactation Note (Signed)
This note was copied from a baby's chart. Lactation Consultation Note  Patient Name: Gina Lucas [id number] Date: 01/06/2021 Reason for consult: Initial assessment;Mother's request;Primapara;1st time breastfeeding;Early term 37-38.6wks;Maternal endocrine disorder Age:26 hours  Mom stated infant fed 2 x prior to [facility] visit while on the floor. LC reviewed with mother feeding based on different cues, importance of STS and different latch positions.   Plan 1. To feed based on cues 8-12x in24 hr period. Mom to offer breast with compression and listen for audible swallows.  2. If unable to latch, Mom to hand express and offer EBM on spoon. 3. I and O sheet reviewed.  4. LC brochure of inpatient and outpatient services provided.  All questions answered at the end of the visit.   Maternal Data Has patient been taught Hand Expression?: Yes  Feeding Mother's Current Feeding Choice: Breast Milk  LATCH Score                    Lactation Tools Discussed/Used    Interventions Interventions: Breast feeding basics reviewed;Breast compression;Skin to skin;Breast massage;Position options;Hand express;Expressed milk;Education  Discharge    Consult Status Consult Status: Follow-up Date: 01/07/21 Follow-up type: In-patient    Nolen Lindamood  Nicholson-Springer 01/06/2021, 6:03 PM

## 2021-01-07 LAB — GLUCOSE, CAPILLARY: Glucose-Capillary: 115 mg/dL — ABNORMAL HIGH (ref 70–99)

## 2021-01-07 LAB — GLUCOSE, RANDOM: Glucose, Bld: 125 mg/dL — ABNORMAL HIGH (ref 70–99)

## 2021-01-07 NOTE — Lactation Note (Signed)
This note was copied from a baby's chart. Lactation Consultation Note  Patient Name: Gina Lucas [id number] Date: 01/07/2021 Reason for consult: Follow-up assessment Age:26 hours   LC Follow Up Visit:    Mother requested lactation assistance.  Baby remained sleeping and mother had to awaken as we discussed.  He has not fed since 1100 this morning and remains sleepy after his afternoon circumcision.  Reviewed breast feeding basics and mother hand expressed 1 ml of colostrum which she spoon fed back to baby.  Attempted to latch, however, baby was not at all interested and would not open to latch.  Reviewed supplementation guidelines and prepared formula bottle for supplementation.  Mother unable to get baby to feed.  Offered to assist with bottle feeding and mother appreciative.    Demonstrated waking techniques and paced bottle feeding.  Baby able to suck effectively; just remains sleepy.  With practice and cheek/jaw support he began to awaken slightly and consumed 13 mls with the extra slow flow nipple.  Burped effectively x 2.  Mother receptive to learning and will call for assistance with the next feeding if she has any difficulty latching or bottle feeding.  Suggested she may want to begin pumping after the next feeding if he does not latch and feed to help protect her milk supply and stimulate her breasts.  Mother in agreement with this plan.  RN updated and asked her to pass on to the night shift to begin pumping if needed.  Father present and asleep on the couch.   Maternal Data    Feeding Mother's Current Feeding Choice: Breast Milk and Formula  LATCH Score Latch: Too sleepy or reluctant, no latch achieved, no sucking elicited.  Audible Swallowing: None  Type of Nipple: Everted at rest and after stimulation  Comfort (Breast/Nipple): Soft / non-tender  Hold (Positioning): Assistance needed to correctly position infant at breast and maintain latch.  LATCH Score:  5   Lactation Tools Discussed/Used    Interventions Interventions: Breast feeding basics reviewed;Assisted with latch;Skin to skin;Breast massage;Hand express;Adjust position;Breast compression;Position options;Support pillows;Education  Discharge Pump: Personal  Consult Status Consult Status: Follow-up Date: 01/07/21 Follow-up type: In-patient    Kaylon Laroche R Marialuiza Car 01/07/2021, 5:04 PM

## 2021-01-07 NOTE — Lactation Note (Signed)
This note was copied from a baby's chart. Lactation Consultation Note  Patient Name: Gina Lucas Date: 01/07/2021 Reason for consult: Follow-up assessment Age:26 hours   P1 mother whose infant is now 75 hours old.  This is an ETI at 37+1 weeks.  Mother has been breast feeding and has supplemented one time with formula.  Baby was swaddled and asleep in the bassinet when I arrived.  He recently had a circumcision.  Mother reported that she was going to awaken him in another half hour if he does not self awaken.  Asked her to call me back for a latch observation to assess his breast feeding ability.  Mother glad to do this and will call the RN to alert me.  Will review more breast feeding basics at this time.   Maternal Data    Feeding    LATCH Score                    Lactation Tools Discussed/Used    Interventions    Discharge    Consult Status Consult Status: Follow-up Date: 01/07/21 Follow-up type: In-patient    Dora Sims 01/07/2021, 3:40 PM

## 2021-01-07 NOTE — Progress Notes (Signed)
Post Partum Day 1 Subjective: Doing well this AM. Bleeding is similar to a period and her pain is under control. She is eating, drinking, voiding, and ambulating without issue. She is passing flatus. She has no concerns at this time.  Objective: Blood pressure 111/68, pulse 80, temperature 98.1 F (36.7 C), temperature source Oral, resp. rate 18, height 5' 6.5" (1.689 m), weight 131.9 kg, last menstrual period 04/21/2020, SpO2 99 %, unknown if currently breastfeeding.  Physical Exam:  General: alert, cooperative, and no distress Lochia: appropriate Uterine Fundus: firm DVT Evaluation: no LE edema, no calf tenderness to palpation  Recent Labs    01/05/21 1558  HGB 12.0  HCT 36.4    Assessment/Plan: 25 year old [id number] s/p SVD.  Progressing well postpartum and meeting milestones.   #A2GDM: Fasting glucose 125 this AM. Will consider Metformin on discharge until postpartum follow up.   Feeding: Breast/Bottle - doing well  Contraception: Undecided; considering BTL but will decide by postpartum visit  Dispo: Plan for discharge later today if infant is able to go home   LOS: 2 days   Worthy Rancher, MD  [facility] Fellow  Faculty Practice 01/07/2021, 2:42 PM

## 2021-01-08 LAB — GLUCOSE, CAPILLARY: Glucose-Capillary: 117 mg/dL — ABNORMAL HIGH (ref 70–99)

## 2021-01-08 MED ORDER — METFORMIN HCL 500 MG PO TABS: 500.0000 mg | ORAL_TABLET | Freq: Two times a day (BID) | ORAL | 0 refills | Status: DC

## 2021-01-08 MED ORDER — IBUPROFEN 200 MG PO TABS: 600.0000 mg | ORAL_TABLET | Freq: Four times a day (QID) | ORAL | Status: DC

## 2021-01-08 MED ORDER — ACETAMINOPHEN 325 MG PO TABS
650.0000 mg | ORAL_TABLET | ORAL | Status: DC | PRN
Start: 2021-01-08 — End: 2021-02-16

## 2021-01-08 NOTE — Lactation Note (Signed)
This note was copied from a baby's chart. Lactation Consultation Note  Patient Name: Gina Lucas [id number] Date: 01/08/2021 Reason for consult: Follow-up assessment;1st time breastfeeding;Primapara;Early term 37-38.6wks;Maternal endocrine disorder Age:26 hours  LC in to visit with P1 Mom of ET infant on day of discharge.  Baby at 3% weight loss.  Mom has been supplementing breastfeeding with formula by bottle.  Mom reports baby has difficulty breastfeeding.    Parents are packed up and ready to be discharged.    Talked about importance of regular, consistent breast stimulation.  If baby is unable to attain a deep latch to the breast, LC encouraged Mom to double pump for 15-20 mins.  Washing and drying of pump parts reviewed.    Encouraged STS and feeding baby often with cues, with a goal of 8-12 feedings per 24 hrs.  Mom states she plans to pump and feed baby her EBM.  Reassured Mom that with guidance from OP Lactation, she could become successful at latching baby to the breast.  Normal ET newborn feeding behavior reviewed.    Offered to message OP Lactation but Mom preferred to call herself.  Engorgement prevention and treatment reviewed. Mom aware of OP Lactation support available to her and encouraged her to call prn.   Lactation Tools Discussed/Used Tools: Pump;Bottle Breast pump type: Double-Electric Breast Pump Pumping frequency: Encouraged to pump with every feeding Pumped volume: 0 mL  Interventions Interventions: Breast feeding basics reviewed;Skin to skin;Breast massage;Hand express;DEBP;Education;Pace feeding  Discharge Discharge Education: Engorgement and breast care;Outpatient recommendation Pump: [facility] grade per Mom)  Consult Status Consult Status: Complete Date: 01/08/21 Follow-up type: Call as needed    Gina Lucas 01/08/2021, 1:42 PM

## 2021-01-10 ENCOUNTER — Other Ambulatory Visit: Payer: Medicaid Other

## 2021-01-11 ENCOUNTER — Encounter: Payer: Medicaid Other | Admitting: Family Medicine

## 2021-01-15 ENCOUNTER — Other Ambulatory Visit: Payer: Self-pay

## 2021-01-16 ENCOUNTER — Telehealth (HOSPITAL_COMMUNITY): Payer: Self-pay | Admitting: *Deleted

## 2021-01-16 NOTE — Telephone Encounter (Signed)
Hospital Discharge Follow-Up Call:  Patient reports that she is doing well overall.  EPDS today was 5 and patient says this accurately reflect that she is doing well emotionally.  She says that baby is doing well.  No concerns about baby's health.  She reports that baby sleeps in a bassinet in the living room during the day and in a crib at night.  ABCs of Safe Sleep reviewed.

## 2021-01-18 ENCOUNTER — Encounter: Payer: Medicaid Other | Admitting: Family Medicine

## 2021-01-30 ENCOUNTER — Telehealth: Payer: Self-pay

## 2021-01-30 NOTE — Telephone Encounter (Signed)
Pt called requesting a return to work note. Pt is scheduled for a PP appt on 02/15/21. Per Dr. Adrian Blackwater, she can return to work.  Gina Lucas l Annesha Delgreco, CMA

## 2021-02-15 ENCOUNTER — Ambulatory Visit (INDEPENDENT_AMBULATORY_CARE_PROVIDER_SITE_OTHER): Payer: Medicaid Other | Admitting: Family Medicine

## 2021-02-15 ENCOUNTER — Other Ambulatory Visit: Payer: Self-pay

## 2021-02-15 DIAGNOSIS — Z8632 Personal history of gestational diabetes: Secondary | ICD-10-CM

## 2021-02-15 NOTE — Progress Notes (Signed)
Post Partum Visit Note  Gina Lucas is a 25 y.o. G55P0030 female who presents for a postpartum visit. She is 6 weeks postpartum following a normal spontaneous vaginal delivery.  I have fully reviewed the prenatal and intrapartum course. The delivery was at 37 gestational weeks.  Anesthesia: epidural. Postpartum course has been uneventful. Baby is doing well. Baby is feeding by bottle Rush Barer soothe . Bleeding moderate lochia. Bowel function is normal. Bladder function is normal. Patient is not sexually active. Contraception method is  IUD vs BTL. Would like micronor in interim . Postpartum depression screening: negative.  She was discharged on metformin and is having nausea and headaches during the day.  The pregnancy intention screening data noted above was reviewed. Potential methods of contraception were discussed. The patient elected to proceed with No data recorded.   Edinburgh Postnatal Depression Scale - 02/15/21 1032       Edinburgh Postnatal Depression Scale:  In the Past 7 Days   I have been able to laugh and see the funny side of things. 0    I have looked forward with enjoyment to things. 0    I have blamed myself unnecessarily when things went wrong. 0    I have been anxious or worried for no good reason. 0    I have felt scared or panicky for no good reason. 0    Things have been getting on top of me. 0    I have been so unhappy that I have had difficulty sleeping. 0    I have felt sad or miserable. 0    I have been so unhappy that I have been crying. 0    The thought of harming myself has occurred to me. 0    Edinburgh Postnatal Depression Scale Total 0             Health Maintenance Due  Topic Date Due   COVID-19 Vaccine (1) Never done   URINE MICROALBUMIN  Never done   HPV VACCINES (1 - 2-dose series) Never done   INFLUENZA VACCINE  Never done    The following portions of the patient's history were reviewed and updated as appropriate: allergies, current  medications, past family history, past medical history, past social history, past surgical history, and problem list.  Review of Systems Pertinent items are noted in HPI.  Objective:  BP 116/70   Pulse 73   Wt 264 lb (119.7 kg)   LMP 04/21/2020 (Exact Date)   BMI 41.97 kg/m    General:  alert, cooperative, and no distress   Breasts:  not indicated  Lungs: clear to auscultation bilaterally  Heart:  regular rate and rhythm, S1, S2 normal, no murmur, click, rub or gallop  Abdomen: soft, non-tender; bowel sounds normal; no masses,  no organomegaly   Wound N/a  GU exam:  not indicated       Assessment:     1. Postpartum exam   2. Vaginal delivery   3. History of gestational diabetes      Plan:   Essential components of care per ACOG recommendations:  1.  Mood and well being: Patient with negative depression screening today. Reviewed local resources for support.  - Patient tobacco use? No.   - hx of drug use? No.    2. Infant care and feeding:  -Patient currently breastmilk feeding? No.  -Social determinants of health (SDOH) reviewed in EPIC. No concerns  3. Sexuality, contraception and birth spacing - Patient  does not want a pregnancy in the next year.  - Reviewed forms of contraception in tiered fashion. Patient desired  IUD or BTL  today.   - Discussed birth spacing of 18 months  4. Sleep and fatigue -Encouraged family/partner/community support of 4 hrs of uninterrupted sleep to help with mood and fatigue  5. Physical Recovery  - Discussed patients delivery and complications. She describes her labor as good. - Patient had a Vaginal, no problems at delivery. Patient had a  right labial  laceration. Perineal healing reviewed. Patient expressed understanding - Patient has urinary incontinence? No. - Patient is safe to resume physical and sexual activity  6.  Health Maintenance - HM due items addressed Yes - Last pap smear  Diagnosis  Date Value Ref Range Status   07/12/2020   Final   - Negative for intraepithelial lesion or malignancy (NILM)   Pap smear not done at today's visit.  -Breast Cancer screening indicated? No.   7. Chronic Disease/Pregnancy Condition follow up:  will get HgA1c follow up in 3 months. Stop metformin for now.  - PCP follow up  Levie Heritage, DO [facility] [employer], [facility]

## 2021-02-16 ENCOUNTER — Encounter: Payer: Self-pay | Admitting: Family Medicine

## 2021-02-16 MED ORDER — NORETHINDRONE 0.35 MG PO TABS: 1.0000 | ORAL_TABLET | Freq: Every day | ORAL | 3 refills | Status: DC

## 2021-03-12 DIAGNOSIS — Z0000 Encounter for general adult medical examination without abnormal findings: Secondary | ICD-10-CM | POA: Diagnosis not present

## 2021-03-12 DIAGNOSIS — Z6841 Body Mass Index (BMI) 40.0 and over, adult: Secondary | ICD-10-CM | POA: Diagnosis not present

## 2021-03-12 DIAGNOSIS — Z8632 Personal history of gestational diabetes: Secondary | ICD-10-CM | POA: Diagnosis not present

## 2021-03-13 DIAGNOSIS — Z0000 Encounter for general adult medical examination without abnormal findings: Secondary | ICD-10-CM | POA: Diagnosis not present

## 2021-03-13 DIAGNOSIS — Z8632 Personal history of gestational diabetes: Secondary | ICD-10-CM | POA: Diagnosis not present

## 2021-03-13 DIAGNOSIS — Z1329 Encounter for screening for other suspected endocrine disorder: Secondary | ICD-10-CM | POA: Diagnosis not present

## 2021-03-13 DIAGNOSIS — R7303 Prediabetes: Secondary | ICD-10-CM | POA: Diagnosis not present

## 2021-03-13 DIAGNOSIS — Z6841 Body Mass Index (BMI) 40.0 and over, adult: Secondary | ICD-10-CM | POA: Diagnosis not present

## 2021-04-19 DIAGNOSIS — Z419 Encounter for procedure for purposes other than remedying health state, unspecified: Secondary | ICD-10-CM | POA: Diagnosis not present

## 2021-05-17 ENCOUNTER — Other Ambulatory Visit: Payer: Medicaid Other

## 2021-05-20 DIAGNOSIS — Z419 Encounter for procedure for purposes other than remedying health state, unspecified: Secondary | ICD-10-CM | POA: Diagnosis not present

## 2021-06-07 DIAGNOSIS — R7303 Prediabetes: Secondary | ICD-10-CM | POA: Diagnosis not present

## 2021-06-12 DIAGNOSIS — R7309 Other abnormal glucose: Secondary | ICD-10-CM | POA: Diagnosis not present

## 2021-06-12 DIAGNOSIS — Z3A01 Less than 8 weeks gestation of pregnancy: Secondary | ICD-10-CM | POA: Diagnosis not present

## 2021-06-12 DIAGNOSIS — Z6841 Body Mass Index (BMI) 40.0 and over, adult: Secondary | ICD-10-CM | POA: Diagnosis not present

## 2021-06-12 DIAGNOSIS — N926 Irregular menstruation, unspecified: Secondary | ICD-10-CM | POA: Diagnosis not present

## 2021-06-20 DIAGNOSIS — Z419 Encounter for procedure for purposes other than remedying health state, unspecified: Secondary | ICD-10-CM | POA: Diagnosis not present

## 2021-06-23 ENCOUNTER — Inpatient Hospital Stay (HOSPITAL_COMMUNITY)
Admission: AD | Admit: 2021-06-23 | Discharge: 2021-06-23 | Disposition: A | Discharge status: Home / Self Care | Payer: Medicaid Other | Attending: Obstetrics and Gynecology | Admitting: Obstetrics and Gynecology

## 2021-06-23 ENCOUNTER — Inpatient Hospital Stay (HOSPITAL_COMMUNITY): Payer: Medicaid Other

## 2021-06-23 ENCOUNTER — Encounter (HOSPITAL_COMMUNITY): Payer: Self-pay | Admitting: Obstetrics and Gynecology

## 2021-06-23 ENCOUNTER — Other Ambulatory Visit: Payer: Self-pay

## 2021-06-23 DIAGNOSIS — Z3490 Encounter for supervision of normal pregnancy, unspecified, unspecified trimester: Secondary | ICD-10-CM

## 2021-06-23 DIAGNOSIS — O26891 Other specified pregnancy related conditions, first trimester: Secondary | ICD-10-CM | POA: Insufficient documentation

## 2021-06-23 DIAGNOSIS — M25552 Pain in left hip: Secondary | ICD-10-CM | POA: Insufficient documentation

## 2021-06-23 DIAGNOSIS — Z3A01 Less than 8 weeks gestation of pregnancy: Secondary | ICD-10-CM | POA: Diagnosis not present

## 2021-06-23 DIAGNOSIS — O26899 Other specified pregnancy related conditions, unspecified trimester: Secondary | ICD-10-CM | POA: Diagnosis not present

## 2021-06-23 DIAGNOSIS — Z6740 Type O blood, Rh positive: Secondary | ICD-10-CM | POA: Diagnosis not present

## 2021-06-23 DIAGNOSIS — O209 Hemorrhage in early pregnancy, unspecified: Secondary | ICD-10-CM | POA: Diagnosis not present

## 2021-06-23 DIAGNOSIS — R109 Unspecified abdominal pain: Secondary | ICD-10-CM | POA: Insufficient documentation

## 2021-06-23 DIAGNOSIS — N939 Abnormal uterine and vaginal bleeding, unspecified: Secondary | ICD-10-CM | POA: Diagnosis not present

## 2021-06-23 DIAGNOSIS — O26851 Spotting complicating pregnancy, first trimester: Secondary | ICD-10-CM | POA: Diagnosis not present

## 2021-06-23 LAB — URINALYSIS, ROUTINE W REFLEX MICROSCOPIC
Bilirubin Urine: NEGATIVE
Glucose, UA: NEGATIVE mg/dL
Ketones, ur: NEGATIVE mg/dL
Nitrite: NEGATIVE
Protein, ur: NEGATIVE mg/dL
Specific Gravity, Urine: 1.025 (ref 1.005–1.030)
pH: 6 (ref 5.0–8.0)

## 2021-06-23 LAB — WET PREP, GENITAL
Clue Cells Wet Prep HPF POC: NONE SEEN
Sperm: NONE SEEN
Trich, Wet Prep: NONE SEEN
WBC, Wet Prep HPF POC: >=10 — AB (ref <10)
Yeast Wet Prep HPF POC: NONE SEEN

## 2021-06-23 LAB — CBC
HCT: 39.6 % (ref 36.0–46.0)
Hemoglobin: 13.4 g/dL (ref 12.0–15.0)
MCH: 29.1 pg (ref 26.0–34.0)
MCHC: 33.8 g/dL (ref 30.0–36.0)
MCV: 86.1 fL (ref 80.0–100.0)
Platelets: 208 10*3/uL (ref 150–400)
RBC: 4.6 MIL/uL (ref 3.87–5.11)
RDW: 13.4 % (ref 11.5–15.5)
WBC: 7.9 10*3/uL (ref 4.0–10.5)
nRBC: 0 % (ref 0.0–0.2)

## 2021-06-23 LAB — URINALYSIS, MICROSCOPIC (REFLEX): Bacteria, UA: NONE SEEN

## 2021-06-23 LAB — TYPE AND SCREEN
ABO/RH(D): O POS
Antibody Screen: NEGATIVE

## 2021-06-23 LAB — HCG, QUANTITATIVE, PREGNANCY: hCG, Beta Chain, Quant, S: 2160 m[IU]/mL — ABNORMAL HIGH (ref <5)

## 2021-06-23 NOTE — Discharge Instructions (Signed)
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
## 2021-06-23 NOTE — MAU Note (Signed)
Pt reports bleeding a little heavier since this am, now it is brown. Also report pain in her left groin that radiates to her left hip since last pm.

## 2021-06-25 LAB — GC/CHLAMYDIA PROBE AMP (~~LOC~~) NOT AT ARMC
Chlamydia: NEGATIVE
Comment: NEGATIVE
Comment: NORMAL
Neisseria Gonorrhea: NEGATIVE

## 2021-07-06 DIAGNOSIS — Z713 Dietary counseling and surveillance: Secondary | ICD-10-CM | POA: Diagnosis not present

## 2021-07-18 DIAGNOSIS — Z419 Encounter for procedure for purposes other than remedying health state, unspecified: Secondary | ICD-10-CM | POA: Diagnosis not present

## 2021-07-20 ENCOUNTER — Encounter: Payer: Self-pay | Admitting: Family Medicine

## 2021-07-20 ENCOUNTER — Ambulatory Visit: Payer: Medicaid Other | Admitting: Family Medicine

## 2021-07-20 ENCOUNTER — Other Ambulatory Visit: Payer: Self-pay

## 2021-07-20 VITALS — BP 111/62 | HR 66 | Wt 274.0 lb

## 2021-07-20 DIAGNOSIS — O039 Complete or unspecified spontaneous abortion without complication: Secondary | ICD-10-CM | POA: Diagnosis not present

## 2021-07-20 MED ORDER — NORGESTIMATE-ETH ESTRADIOL 0.25-35 MG-MCG PO TABS: 1.0000 | ORAL_TABLET | Freq: Every day | ORAL | 3 refills | Status: DC

## 2021-07-20 NOTE — Progress Notes (Signed)
? ?  Subjective:  ? ? Patient ID: Gina Lucas, female    DOB: 1996-03-30, 26 y.o.   MRN: [id number] ? ?HPI ? ?Patient seen for follow-up of miscarriage.  She reports no bleeding.  The pregnancy was not intended. ? ?Review of Systems ? ?   ?Objective:  ? Physical Exam ?Vitals reviewed.  ?Constitutional:   ?   Appearance: Normal appearance.  ?Pulmonary:  ?   Effort: Pulmonary effort is normal.  ?Neurological:  ?   Mental Status: She is alert.  ?Psychiatric:     ?   Mood and Affect: Mood normal.     ?   Behavior: Behavior normal.     ?   Thought Content: Thought content normal.     ?   Judgment: Judgment normal.  ? ?   ?Assessment & Plan:  ?1. SAB (spontaneous abortion) ?Discussed recovery.  We will start the patient on OCPs.  Patient does have heavy periods, so we will prescribe this continuously for the patient.  No other concerns by patient.  Denies history of migraines, history of blood clots ? ?

## 2021-08-16 ENCOUNTER — Encounter: Payer: Self-pay | Admitting: Family Medicine

## 2021-08-18 DIAGNOSIS — Z419 Encounter for procedure for purposes other than remedying health state, unspecified: Secondary | ICD-10-CM | POA: Diagnosis not present

## 2021-08-23 MED ORDER — NORGESTIMATE-ETH ESTRADIOL 0.25-35 MG-MCG PO TABS: 1.0000 | ORAL_TABLET | Freq: Every day | ORAL | 3 refills | Status: DC

## 2021-09-17 DIAGNOSIS — Z419 Encounter for procedure for purposes other than remedying health state, unspecified: Secondary | ICD-10-CM | POA: Diagnosis not present

## 2021-10-09 ENCOUNTER — Encounter: Payer: Self-pay | Admitting: Family Medicine

## 2021-10-11 MED ORDER — ETONOGESTREL-ETHINYL ESTRADIOL 0.12-0.015 MG/24HR VA RING: VAGINAL_RING | VAGINAL | 4 refills | Status: DC

## 2021-10-18 DIAGNOSIS — Z419 Encounter for procedure for purposes other than remedying health state, unspecified: Secondary | ICD-10-CM | POA: Diagnosis not present

## 2021-11-12 DIAGNOSIS — Z6841 Body Mass Index (BMI) 40.0 and over, adult: Secondary | ICD-10-CM | POA: Diagnosis not present

## 2021-11-17 DIAGNOSIS — Z419 Encounter for procedure for purposes other than remedying health state, unspecified: Secondary | ICD-10-CM | POA: Diagnosis not present

## 2021-12-18 DIAGNOSIS — Z419 Encounter for procedure for purposes other than remedying health state, unspecified: Secondary | ICD-10-CM | POA: Diagnosis not present

## 2022-03-20 DIAGNOSIS — Z419 Encounter for procedure for purposes other than remedying health state, unspecified: Secondary | ICD-10-CM | POA: Diagnosis not present

## 2022-04-03 ENCOUNTER — Ambulatory Visit (INDEPENDENT_AMBULATORY_CARE_PROVIDER_SITE_OTHER): Payer: Medicaid Other | Admitting: Family Medicine

## 2022-04-03 ENCOUNTER — Encounter: Payer: Self-pay | Admitting: Family Medicine

## 2022-04-03 VITALS — Wt 273.0 lb

## 2022-04-03 DIAGNOSIS — O039 Complete or unspecified spontaneous abortion without complication: Secondary | ICD-10-CM | POA: Diagnosis not present

## 2022-04-03 DIAGNOSIS — O0990 Supervision of high risk pregnancy, unspecified, unspecified trimester: Secondary | ICD-10-CM | POA: Insufficient documentation

## 2022-04-03 DIAGNOSIS — N96 Recurrent pregnancy loss: Secondary | ICD-10-CM | POA: Diagnosis not present

## 2022-04-03 DIAGNOSIS — O09899 Supervision of other high risk pregnancies, unspecified trimester: Secondary | ICD-10-CM | POA: Insufficient documentation

## 2022-04-03 NOTE — Progress Notes (Signed)
   Subjective:    Patient ID: Gina Lucas, female    DOB: [birth year], 26 y.o.   MRN: [id number]  HPI Patient scheduled for new OB appointment, however patient started bleeding 3 days ago.  She passed large clots and pregnancy tissue.  Bleeding has stopped at this point. This is her 6th miscarriage. Has never had F/u for recurrent pregnancy loss.   Review of Systems     Objective:   Physical Exam Vitals and nursing note reviewed.  Constitutional:      Appearance: Normal appearance.  Pulmonary:     Effort: Pulmonary effort is normal.  Skin:    General: Skin is warm and dry.     Capillary Refill: Capillary refill takes less than 2 seconds.  Neurological:     General: No focal deficit present.     Mental Status: She is alert.  Psychiatric:        Mood and Affect: Mood normal.        Behavior: Behavior normal.        Thought Content: Thought content normal.        Judgment: Judgment normal.       Assessment & Plan:  1. Spontaneous abortion Discussed that there is nothing that she did wrong to cause this to happen.  We will check hCG and HgA1c. - B-HCG Quant - Hemoglobin A1c - Lupus anticoagulant panel - Beta hCG quant (ref lab)  2. Recurrent pregnancy loss Lupus anticoagulant panel - Hemoglobin A1c - Lupus anticoagulant panel

## 2022-04-03 NOTE — Progress Notes (Signed)
Patient reports 4 days of heavy bleeding at end of October. Patient states that she could not go to the hospital because she mentally couldn't handle it.  Bedside ultrasound showed no IUP. Endometrial strip measured 1.08 cm. Provider made aware. Armandina Stammer RN

## 2022-04-04 LAB — BETA HCG QUANT (REF LAB): hCG Quant: 17 m[IU]/mL

## 2022-04-06 LAB — HEMOGLOBIN A1C
Est. average glucose Bld gHb Est-mCnc: 126 mg/dL
Hgb A1c MFr Bld: 6 % — ABNORMAL HIGH (ref 4.8–5.6)

## 2022-04-06 LAB — LUPUS ANTICOAGULANT PANEL
Dilute Viper Venom Time: 33.7 s (ref 0.0–47.0)
PTT Lupus Anticoagulant: 36.2 s (ref 0.0–43.5)

## 2022-04-19 DIAGNOSIS — Z419 Encounter for procedure for purposes other than remedying health state, unspecified: Secondary | ICD-10-CM | POA: Diagnosis not present

## 2022-05-20 DIAGNOSIS — Z419 Encounter for procedure for purposes other than remedying health state, unspecified: Secondary | ICD-10-CM | POA: Diagnosis not present

## 2022-06-10 ENCOUNTER — Ambulatory Visit: Payer: Medicaid Other | Admitting: Obstetrics and Gynecology

## 2022-06-10 ENCOUNTER — Encounter: Payer: Self-pay | Admitting: Obstetrics and Gynecology

## 2022-06-10 VITALS — BP 115/52 | HR 68 | Ht 67.0 in | Wt 281.0 lb

## 2022-06-10 DIAGNOSIS — N96 Recurrent pregnancy loss: Secondary | ICD-10-CM
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
## 2022-06-10 NOTE — Progress Notes (Signed)
Patient presents for hysterectomy consult. Kathrene Alu RN

## 2022-06-10 NOTE — Patient Instructions (Addendum)
Sterilization to prevent pregnancy altogether  Pelvic ultrasound to look at your uteurs and ovaries  Medication for mood swings/depression for menses include birth control and anti-depressants.

## 2022-06-13 ENCOUNTER — Ambulatory Visit (HOSPITAL_BASED_OUTPATIENT_CLINIC_OR_DEPARTMENT_OTHER)
Admission: RE | Admit: 2022-06-13 | Discharge: 2022-06-13 | Disposition: A | Discharge status: Home / Self Care | Payer: Medicaid Other | Source: Ambulatory Visit | Attending: Obstetrics and Gynecology | Admitting: Obstetrics and Gynecology

## 2022-06-13 DIAGNOSIS — N96 Recurrent pregnancy loss: Secondary | ICD-10-CM | POA: Diagnosis not present

## 2022-06-20 ENCOUNTER — Ambulatory Visit (INDEPENDENT_AMBULATORY_CARE_PROVIDER_SITE_OTHER): Payer: Medicaid Other | Admitting: Family Medicine

## 2022-06-20 ENCOUNTER — Encounter: Payer: Self-pay | Admitting: Family Medicine

## 2022-06-20 ENCOUNTER — Other Ambulatory Visit (HOSPITAL_COMMUNITY)
Admission: RE | Admit: 2022-06-20 | Discharge: 2022-06-20 | Disposition: A | Discharge status: Home / Self Care | Payer: Medicaid Other | Source: Ambulatory Visit | Attending: Family Medicine | Admitting: Family Medicine

## 2022-06-20 VITALS — BP 123/67 | HR 72 | Ht 67.0 in | Wt 284.0 lb

## 2022-06-20 DIAGNOSIS — Z01419 Encounter for gynecological examination (general) (routine) without abnormal findings: Secondary | ICD-10-CM | POA: Diagnosis not present

## 2022-06-20 DIAGNOSIS — N939 Abnormal uterine and vaginal bleeding, unspecified: Secondary | ICD-10-CM

## 2022-06-20 DIAGNOSIS — Z3201 Encounter for pregnancy test, result positive: Secondary | ICD-10-CM | POA: Diagnosis not present

## 2022-06-20 DIAGNOSIS — N96 Recurrent pregnancy loss: Secondary | ICD-10-CM

## 2022-06-20 DIAGNOSIS — Z419 Encounter for procedure for purposes other than remedying health state, unspecified: Secondary | ICD-10-CM | POA: Diagnosis not present

## 2022-06-20 LAB — POCT URINE PREGNANCY
Preg Test, Ur: NEGATIVE
Preg Test, Ur: POSITIVE — AB

## 2022-06-20 NOTE — Progress Notes (Signed)
ANNUAL EXAM Patient name: Gina Lucas MRN [id number]  Date of birth: 1996-02-14 Chief Complaint:   Annual Exam  History of Present Illness:   Gina Lucas is a 27 y.o.  [phone]  female  being seen today for a routine annual exam.  Current complaints: AUB-HMB. Has 3 day menses, bleeds through tampons. Changes tampons every 2-3 hours due to heaviness of flow. Has used COCs in the past - doesn't like the way she feels on them - has increased depression. She did have IUD which improved her periods, but her partner states that he could feel the IUD strings. She didn't do well with the nuvaring - she had pain with them.   Did meet with the gyn surgeon - talked about hysterectomy due to RPL, which there is no indication. Patient would still like hysterectomy, mostly because of HMB and doesn't want to do birth control.  Patient's last menstrual period was 05/22/2021 (exact date).    Last pap 2022. Results were:  normal . H/O abnormal pap: no Last mammogram: n/a.      04/03/2022   10:10 AM 12/28/2020    9:13 AM 11/09/2020    1:34 PM 11/01/2020    8:47 AM 10/08/2016   10:18 AM  Depression screen PHQ 2/9  Decreased Interest 1 1 0 0 2  Down, Depressed, Hopeless 2 1 0 0 1  PHQ - 2 Score 3 2 0 0 3  Altered sleeping 2 2  3 3   Tired, decreased energy 1 1  1 1   Change in appetite 2 2  0 2  Feeling bad or failure about yourself  2 1  0 1  Trouble concentrating 2 3  0 2  Moving slowly or fidgety/restless 0 0  0 0  Suicidal thoughts 1 0  0 0  PHQ-9 Score 13 11  4 12   Difficult doing work/chores    Not difficult at all         04/03/2022   10:10 AM 12/28/2020    9:14 AM 11/01/2020    8:47 AM 10/08/2016   10:18 AM  GAD 7 : Generalized Anxiety Score  Nervous, Anxious, on Edge 0 3 0 3  Control/stop worrying 0 2 0 2  Worry too much - different things 0 3 0 1  Trouble relaxing 0 2 0 2  Restless 0 0 0 0  Easily annoyed or irritable 2 3 1 3   Afraid - awful might happen 0 1 0 3  Total GAD 7  Score 2 14 1 14      Review of Systems:   Pertinent items are noted in HPI Denies any headaches, blurred vision, fatigue, shortness of breath, chest pain, abdominal pain, abnormal vaginal discharge/itching/odor/irritation, problems with periods, bowel movements, urination, or intercourse unless otherwise stated above. Pertinent History Reviewed:  Reviewed past medical,surgical, social and family history.  Reviewed problem list, medications and allergies. Physical Assessment:   Vitals:   06/20/22 0916  BP: 123/67  Pulse: 72  Weight: 284 lb (128.8 kg)  Height: 5\' 7"  (1.702 m)  Body mass index is 44.48 kg/m.        Physical Examination:   General appearance - well appearing, and in no distress  Mental status - alert, oriented to person, place, and time  Psych:  She has a normal mood and affect  Skin - warm and dry, normal color, no suspicious lesions noted  Chest - effort normal, all lung fields clear to auscultation bilaterally  Heart -  normal rate and regular rhythm  Neck:  midline trachea, no thyromegaly or nodules  Breasts - breasts appear normal, no suspicious masses, no skin or nipple changes or axillary nodes  Abdomen - soft, nontender, nondistended, no masses or organomegaly  Pelvic - VULVA: normal appearing vulva with no masses, tenderness or lesions  VAGINA: normal appearing vagina with normal color and discharge, no lesions  CERVIX: normal appearing cervix without discharge or lesions, no CMT  Thin prep pap is done with HR HPV cotesting  UTERUS: uterus is felt to be normal size, shape, consistency and nontender   ADNEXA: No adnexal masses or tenderness noted.  Extremities:  No swelling or varicosities noted  Chaperone present for exam  Assessment & Plan:  1. Well woman exam with routine gynecological exam PAP today - HIV antibody (with reflex) - RPR - Hepatitis C Antibody - Hepatitis B Surface AntiGEN - Cytology - PAP( [place]) - CBC  2. Positive pregnancy  test I received results of UPT after patient left. I called her and discussed options. Although there is no data to support this, I discussed possibility of doing progesterone tablets to see if this will help with the pregnancy. The patient is not even sure if she wants to keep the pregnancy at this point - she is so nervous about another loss. We did talk about termination - she will think about options and let me know. - POCT urine pregnancy - POCT urine pregnancy  3. Recurrent pregnancy loss  4. Abnormal uterine bleeding (AUB) Discussed options - replacing IUD, trial of different COC, POP, etc. Patient undecided. Check CBC to screen for anemia due to HMB.   Labs/procedures today:  Orders Placed This Encounter  Procedures   HIV antibody (with reflex)   RPR   Hepatitis C Antibody   Hepatitis B Surface AntiGEN   CBC   POCT urine pregnancy   POCT urine pregnancy    Meds: No orders of the defined types were placed in this encounter.   Follow-up: No follow-ups on file.  Truett Mainland, DO 06/20/2022 1:15 PM

## 2022-06-20 NOTE — Progress Notes (Signed)
Negative UPT result was entered in error.  Alleya Demeter l Adanely Reynoso, CMA

## 2022-06-22 LAB — CBC
Hematocrit: 41.1 % (ref 34.0–46.6)
Hemoglobin: 13.1 g/dL (ref 11.1–15.9)
MCH: 27.4 pg (ref 26.6–33.0)
MCHC: 31.9 g/dL (ref 31.5–35.7)
MCV: 86 fL (ref 79–97)
Platelets: 219 10*3/uL (ref 150–450)
RBC: 4.78 x10E6/uL (ref 3.77–5.28)
RDW: 13.1 % (ref 11.7–15.4)
WBC: 7.5 10*3/uL (ref 3.4–10.8)

## 2022-06-22 LAB — RPR: RPR Ser Ql: NONREACTIVE

## 2022-06-22 LAB — HIV ANTIBODY (ROUTINE TESTING W REFLEX): HIV Screen 4th Generation wRfx: NONREACTIVE

## 2022-06-22 LAB — HEPATITIS C ANTIBODY: Hep C Virus Ab: NONREACTIVE

## 2022-06-22 LAB — HEPATITIS B SURFACE ANTIGEN: Hepatitis B Surface Ag: NEGATIVE

## 2022-06-24 LAB — CYTOLOGY - PAP
Chlamydia: NEGATIVE
Comment: NEGATIVE
Comment: NEGATIVE
Comment: NORMAL
Diagnosis: NEGATIVE
Neisseria Gonorrhea: NEGATIVE
Trichomonas: NEGATIVE

## 2022-07-06 ENCOUNTER — Inpatient Hospital Stay (HOSPITAL_COMMUNITY): Payer: Medicaid Other

## 2022-07-06 ENCOUNTER — Inpatient Hospital Stay (HOSPITAL_COMMUNITY)
Admission: AD | Admit: 2022-07-06 | Discharge: 2022-07-06 | Disposition: A | Discharge status: Home / Self Care | Payer: Medicaid Other | Attending: Family Medicine | Admitting: Family Medicine

## 2022-07-06 ENCOUNTER — Encounter (HOSPITAL_COMMUNITY): Payer: Self-pay | Admitting: Family Medicine

## 2022-07-06 DIAGNOSIS — O26891 Other specified pregnancy related conditions, first trimester: Secondary | ICD-10-CM | POA: Diagnosis not present

## 2022-07-06 DIAGNOSIS — O039 Complete or unspecified spontaneous abortion without complication: Secondary | ICD-10-CM

## 2022-07-06 DIAGNOSIS — O99331 Smoking (tobacco) complicating pregnancy, first trimester: Secondary | ICD-10-CM | POA: Insufficient documentation

## 2022-07-06 DIAGNOSIS — O209 Hemorrhage in early pregnancy, unspecified: Secondary | ICD-10-CM | POA: Diagnosis not present

## 2022-07-06 DIAGNOSIS — Z3A01 Less than 8 weeks gestation of pregnancy: Secondary | ICD-10-CM

## 2022-07-06 LAB — CBC
HCT: 40.6 % (ref 36.0–46.0)
Hemoglobin: 13.6 g/dL (ref 12.0–15.0)
MCH: 27.8 pg (ref 26.0–34.0)
MCHC: 33.5 g/dL (ref 30.0–36.0)
MCV: 83 fL (ref 80.0–100.0)
Platelets: 241 10*3/uL (ref 150–400)
RBC: 4.89 MIL/uL (ref 3.87–5.11)
RDW: 13.8 % (ref 11.5–15.5)
WBC: 10.7 10*3/uL — ABNORMAL HIGH (ref 4.0–10.5)
nRBC: 0 % (ref 0.0–0.2)

## 2022-07-06 LAB — WET PREP, GENITAL
Clue Cells Wet Prep HPF POC: NONE SEEN
Sperm: NONE SEEN
Trich, Wet Prep: NONE SEEN
WBC, Wet Prep HPF POC: <10 (ref <10)
Yeast Wet Prep HPF POC: NONE SEEN

## 2022-07-06 LAB — COMPREHENSIVE METABOLIC PANEL
ALT: 26 U/L (ref 0–44)
AST: 22 U/L (ref 15–41)
Albumin: 3.6 g/dL (ref 3.5–5.0)
Alkaline Phosphatase: 42 U/L (ref 38–126)
Anion gap: 9 (ref 5–15)
BUN: 9 mg/dL (ref 6–20)
CO2: 21 mmol/L — ABNORMAL LOW (ref 22–32)
Calcium: 9 mg/dL (ref 8.9–10.3)
Chloride: 101 mmol/L (ref 98–111)
Creatinine, Ser: 0.7 mg/dL (ref 0.44–1.00)
GFR, Estimated: >60 mL/min (ref >60)
Glucose, Bld: 176 mg/dL — ABNORMAL HIGH (ref 70–99)
Potassium: 4.4 mmol/L (ref 3.5–5.1)
Sodium: 131 mmol/L — ABNORMAL LOW (ref 135–145)
Total Bilirubin: 0.8 mg/dL (ref 0.3–1.2)
Total Protein: 6.4 g/dL — ABNORMAL LOW (ref 6.5–8.1)

## 2022-07-06 LAB — HCG, QUANTITATIVE, PREGNANCY: hCG, Beta Chain, Quant, S: 15860 m[IU]/mL — ABNORMAL HIGH (ref <5)

## 2022-07-06 LAB — HIV ANTIBODY (ROUTINE TESTING W REFLEX): HIV Screen 4th Generation wRfx: NONREACTIVE

## 2022-07-06 MED ORDER — OXYCODONE-ACETAMINOPHEN 5-325 MG PO TABS
2.0000 | ORAL_TABLET | Freq: Once | ORAL | Status: AC
  Administered 2022-07-06: 2 via ORAL
  Filled 2022-07-06: qty 2

## 2022-07-06 MED ORDER — OXYCODONE-ACETAMINOPHEN 5-325 MG PO TABS: 1.0000 | ORAL_TABLET | Freq: Four times a day (QID) | ORAL | 0 refills | Status: DC | PRN
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
## 2022-07-06 NOTE — MAU Note (Signed)
Pt says  she  went to [facility] [place]  with Dr Nehemiah Settle- positive UPT on 06-18-2022  3 weeks and 2 days  This morning at 0300- woke  with blood spot on sheet -  and she thinks she passed the sac. Feels a lot of lower abd pain - toward right side  In Triage - pad - red line

## 2022-07-08 ENCOUNTER — Ambulatory Visit: Payer: Medicaid Other

## 2022-07-08 ENCOUNTER — Telehealth: Payer: Self-pay | Admitting: General Practice

## 2022-07-08 NOTE — Telephone Encounter (Signed)
Left message on VM for pt to contact office to reschedule appt to check HCG.  Pt missed appt this morning 07/08/2022 at 0815.

## 2022-07-19 DIAGNOSIS — Z419 Encounter for procedure for purposes other than remedying health state, unspecified: Secondary | ICD-10-CM | POA: Diagnosis not present

## 2022-07-30 ENCOUNTER — Other Ambulatory Visit: Payer: Medicaid Other

## 2022-07-30 DIAGNOSIS — O039 Complete or unspecified spontaneous abortion without complication: Secondary | ICD-10-CM

## 2022-07-30 DIAGNOSIS — N96 Recurrent pregnancy loss: Secondary | ICD-10-CM

## 2022-07-30 NOTE — Progress Notes (Signed)
Patient sent to lab for  HCG. Gaynelle Pastrana RN °

## 2022-07-31 LAB — BETA HCG QUANT (REF LAB): hCG Quant: 2 m[IU]/mL

## 2022-08-04 IMAGING — US US MFM OB FOLLOW-UP
1 series · 16 of 28 positions shown · non-contrast
Comparison: none

[Series 1: us mfm ob follow-up · 16 of 48 slices shown]
[im 1/48]
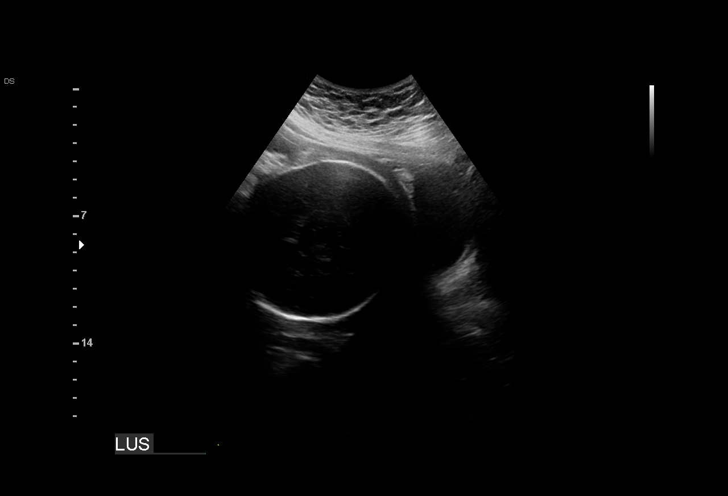
[im 4/48]
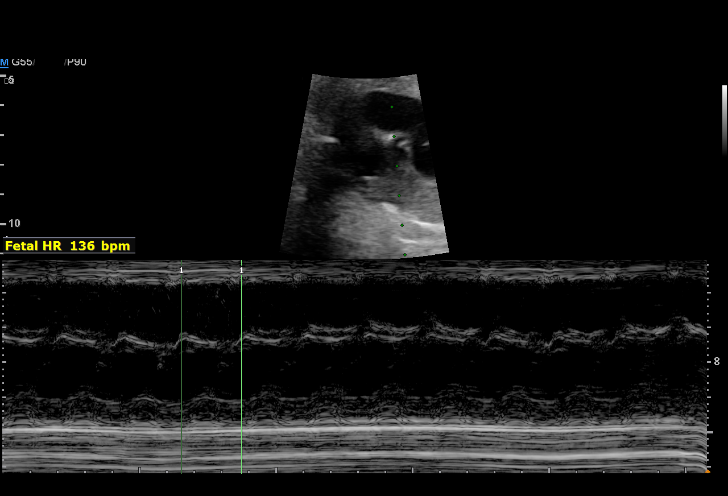
[im 7/48]
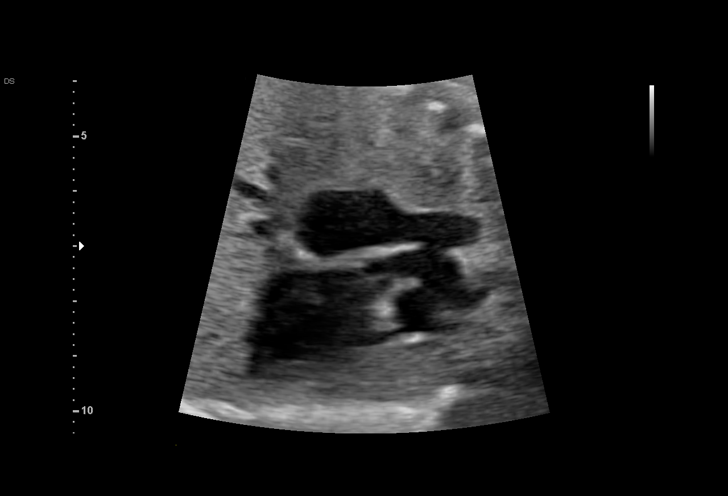
[im 11/48]
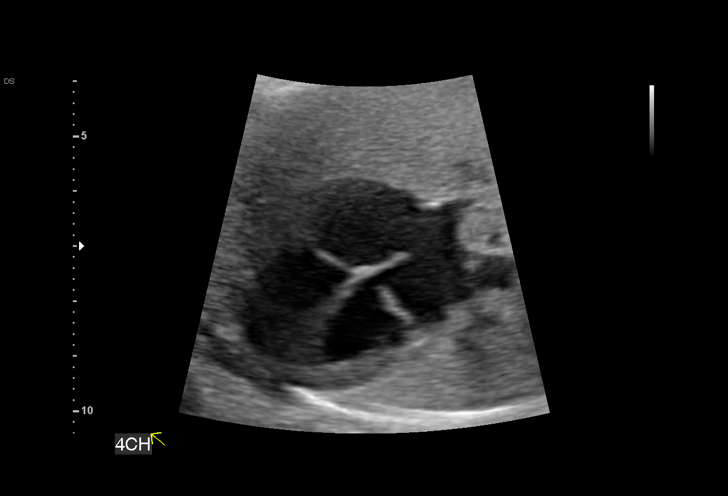
[im 13/48]
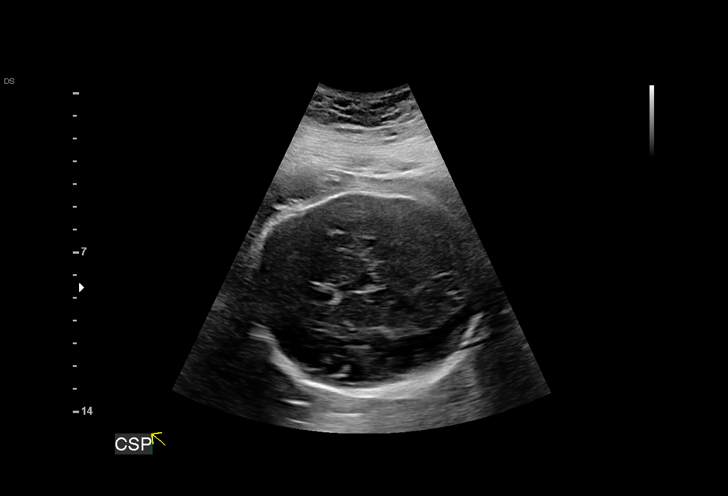
[im 16/48]
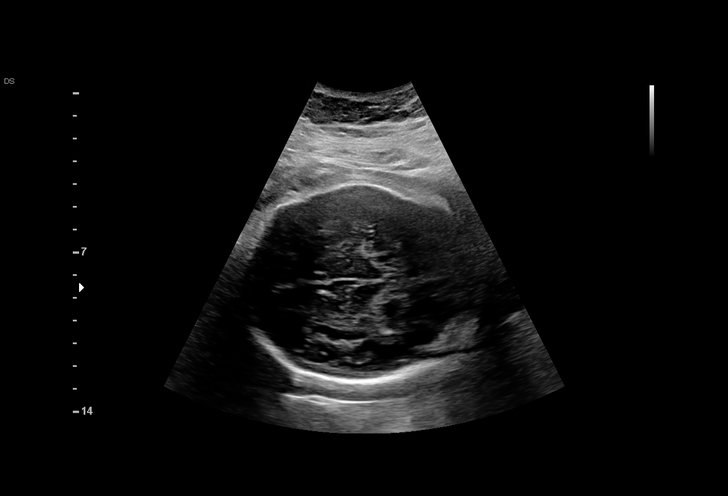
[im 20/48]
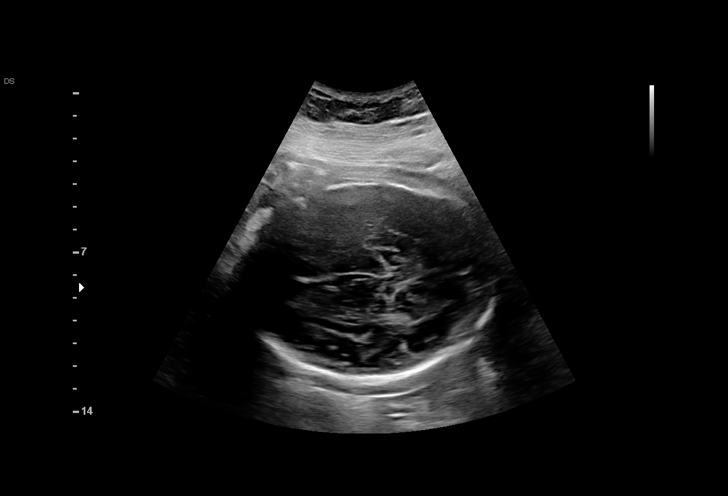
[im 23/48]
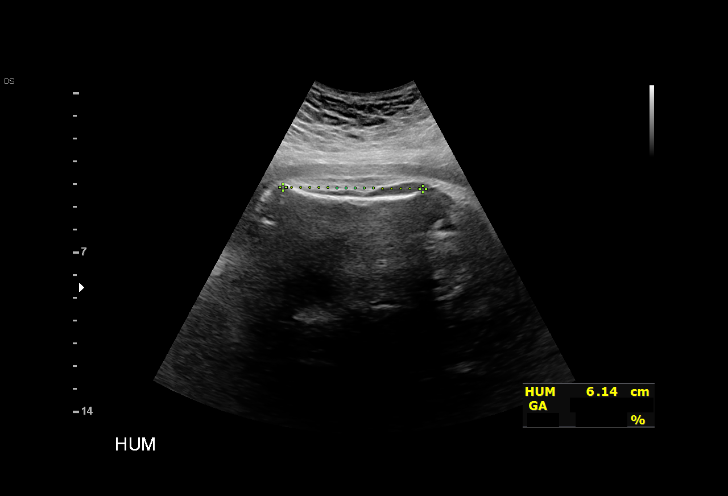
[im 25/48]
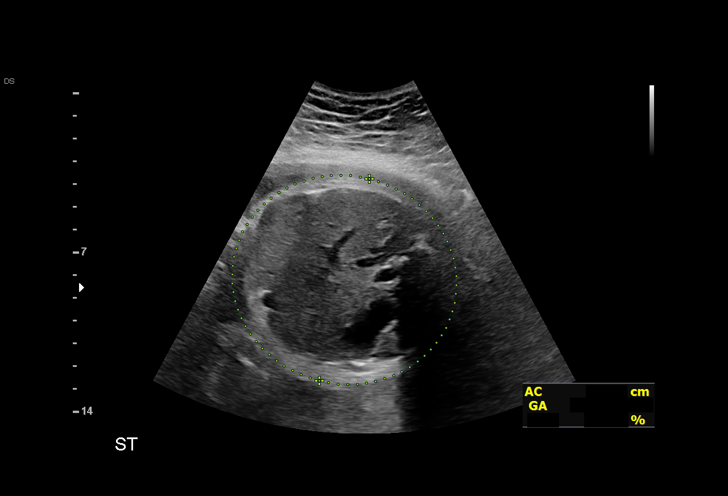
[im 28/48]
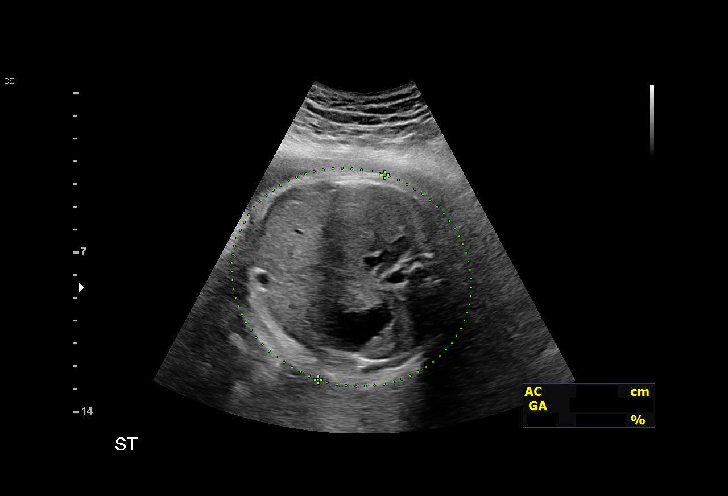
[im 32/48]
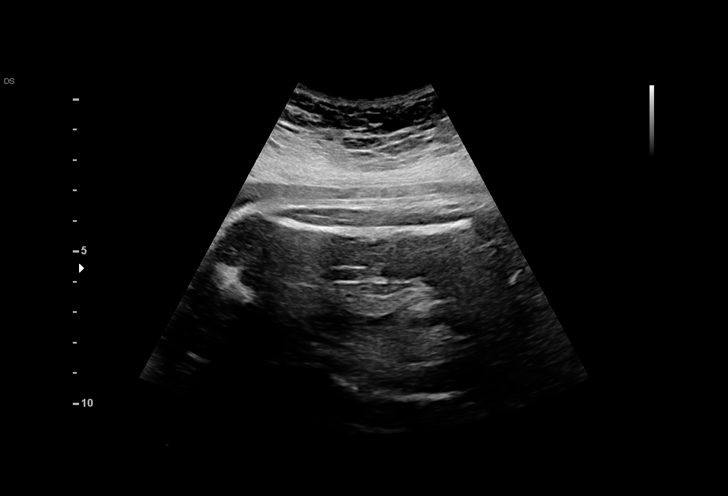
[im 35/48]
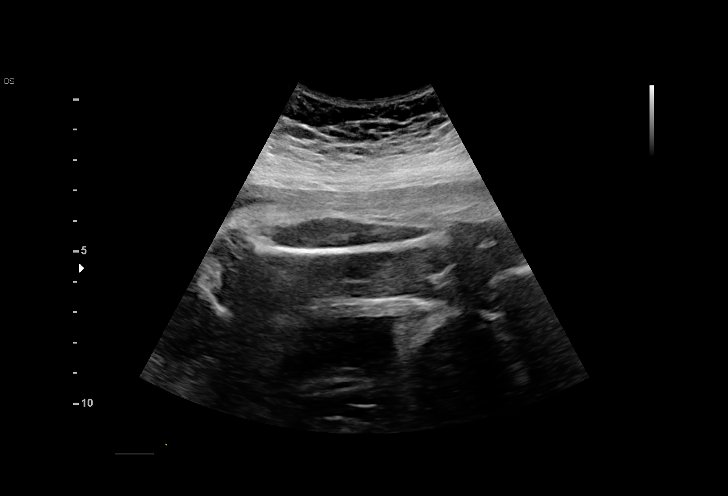
[im 37/48]
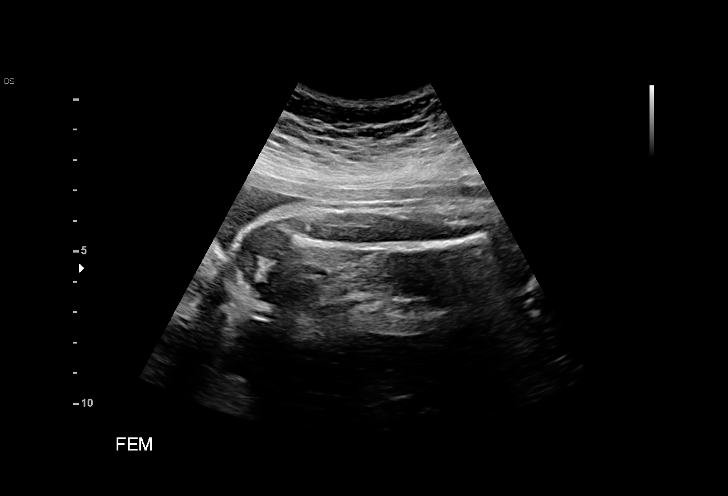
[im 41/48]
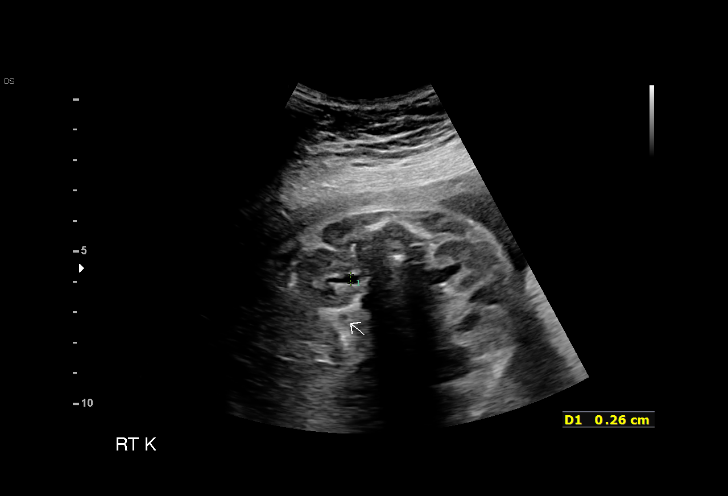
[im 44/48]
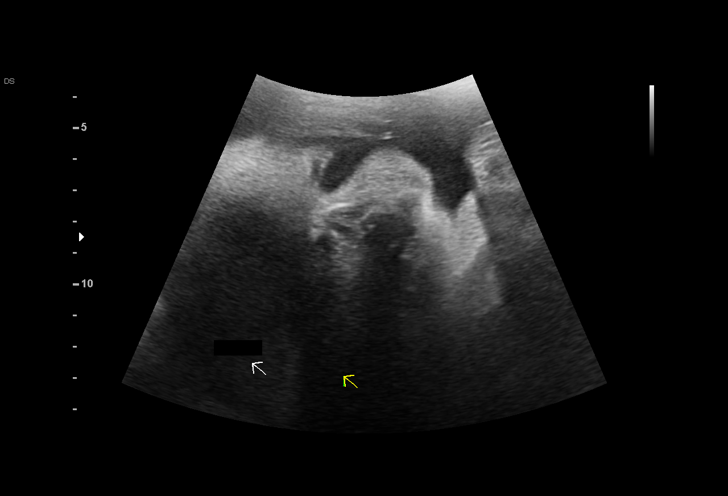
[im 48/48]
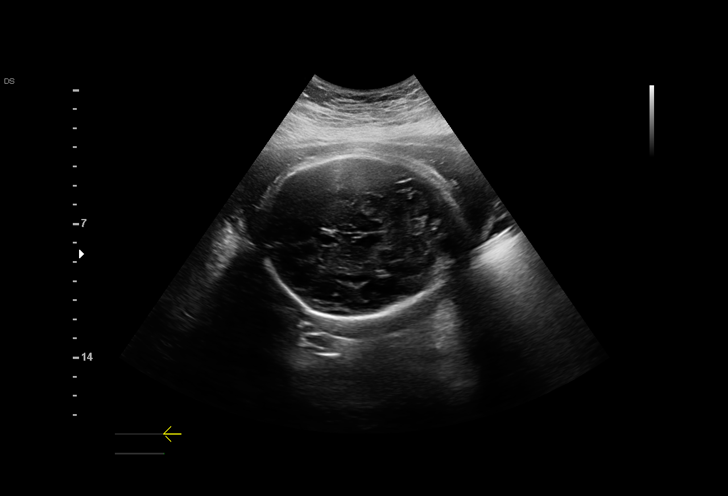

[16 of 28 positions shown; findings below may reference images not displayed]

[id number]

Indications

 Gestational diabetes in pregnancy, insulin
 controlled
 Obesity complicating pregnancy, second
 trimester (BMI 38)
 Poor obstetric history-Recurrent (habitual)
 abortion (3 consecutive ab's)
 Antenatal follow-up for nonvisualized fetal
 anatomy
 Low Risk NIPS

## 2022-08-11 IMAGING — US US FETAL BPP W/ NON-STRESS
1 series · 13 of 14 positions shown · non-contrast
Comparison: none

[Series 1: us fetal bpp w/ non-stress · 14 acquisitions, 13 frames shown]
[im 1/14]
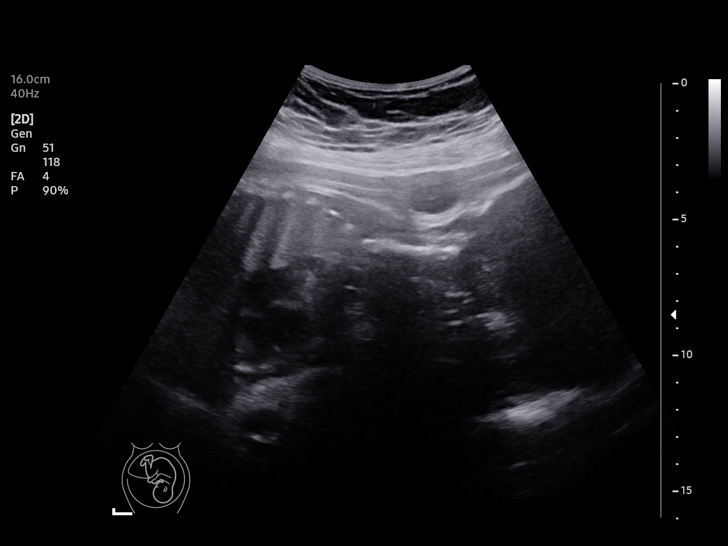
[im 2/14]
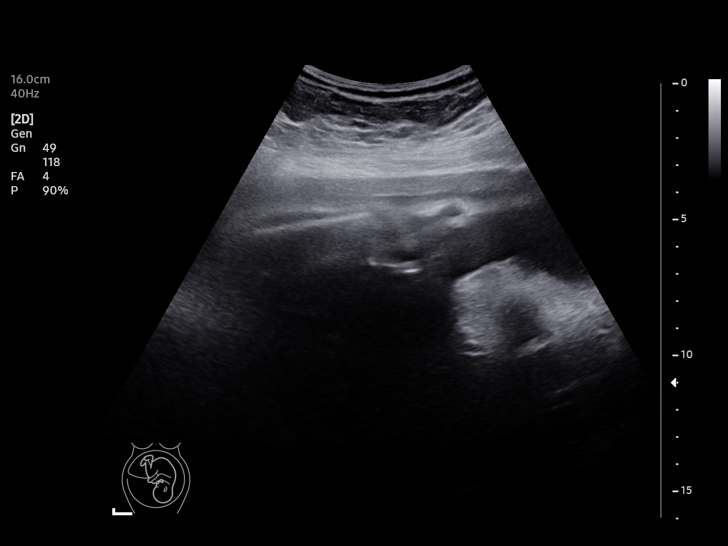
[im 3/14]
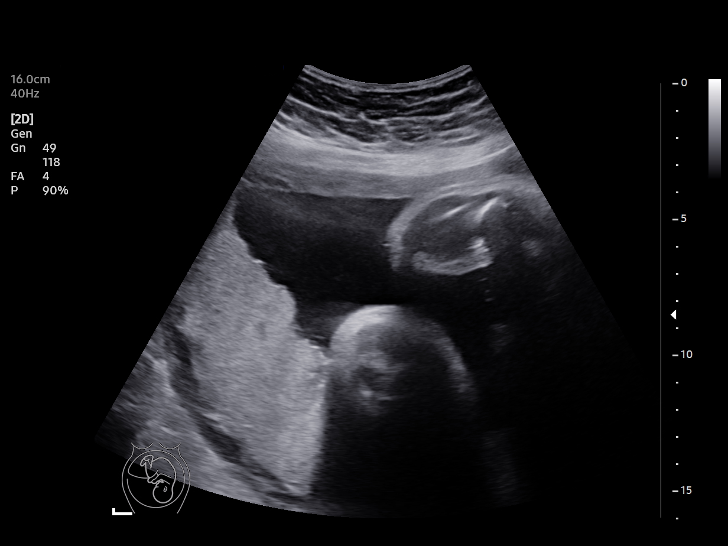
[im 4/14]
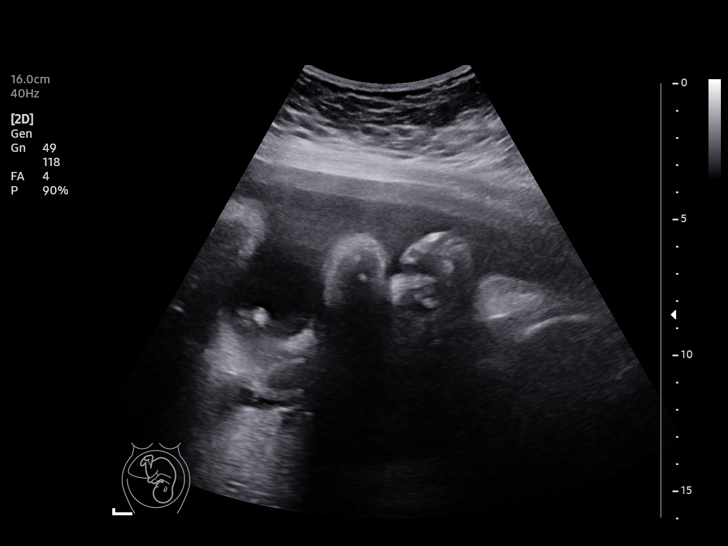
[im 5/14]
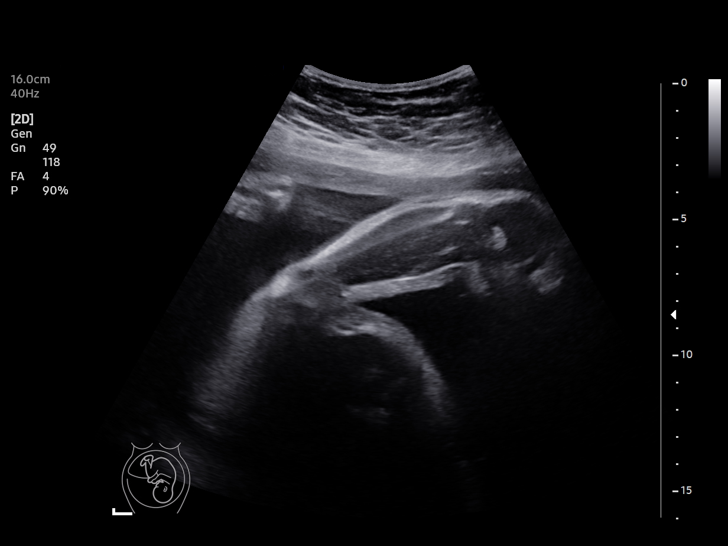
[im 6/14]
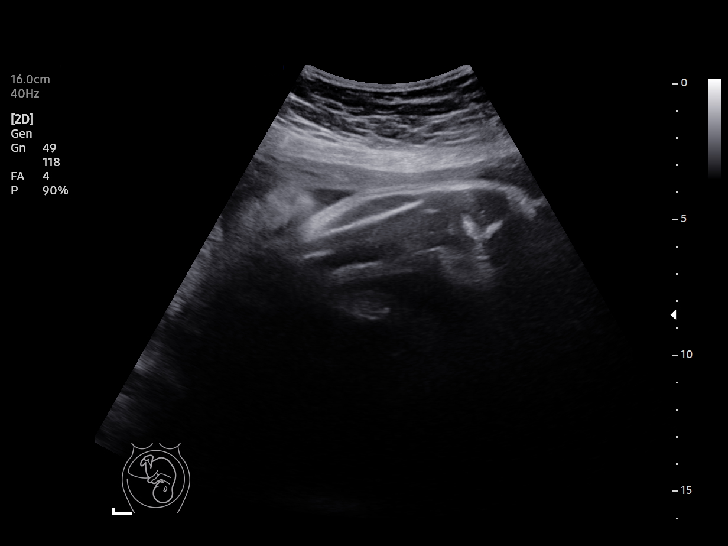
[im 8/14]
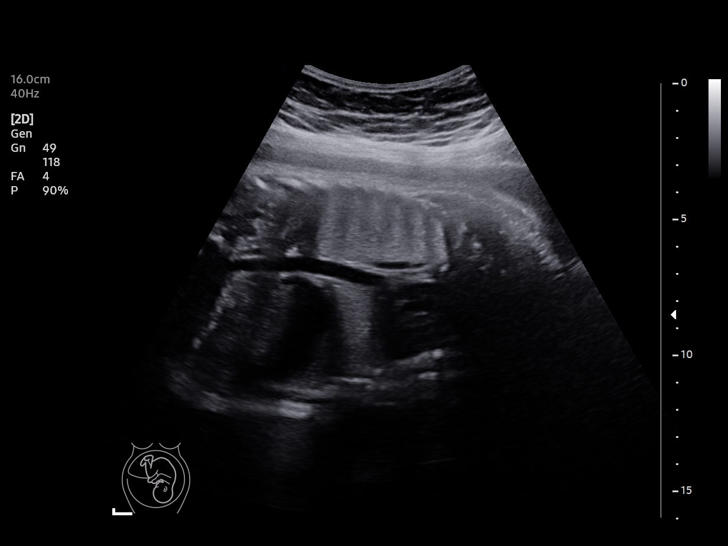
[im 9/14]
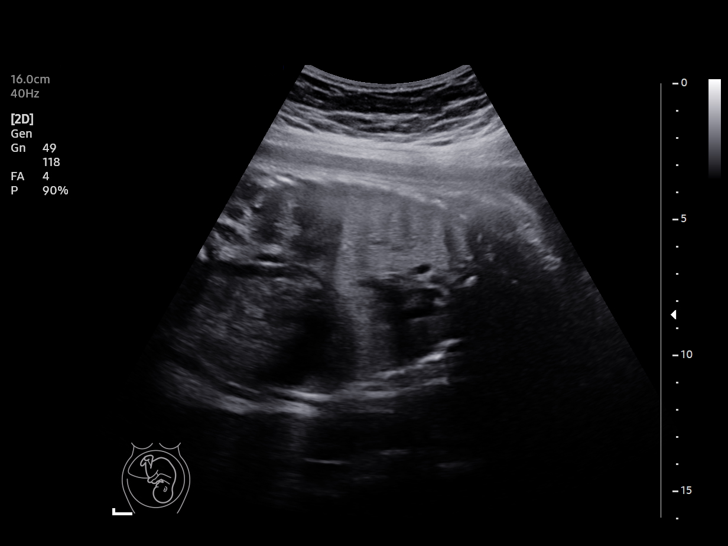
[im 10/14]
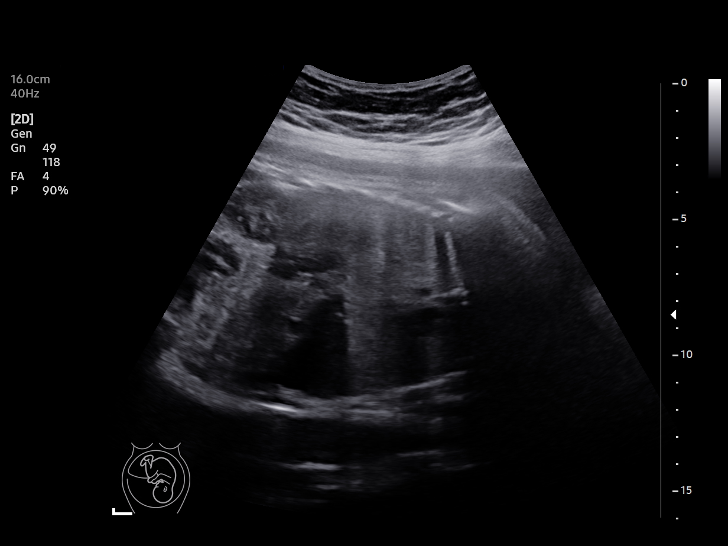
[im 11/14]
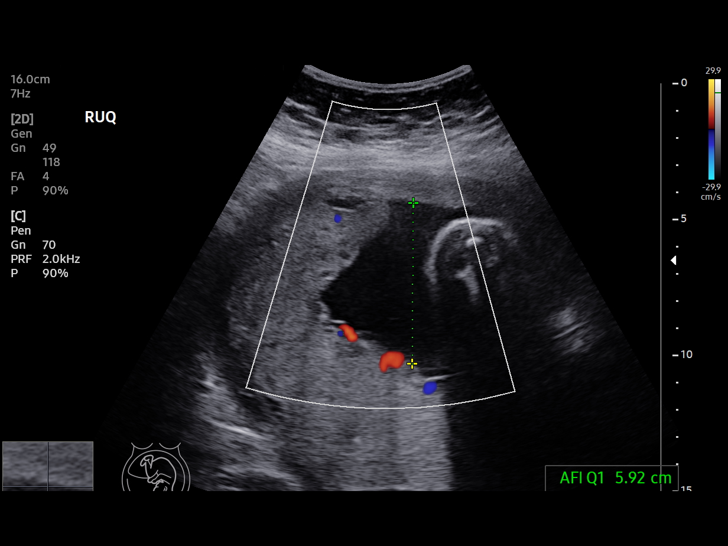
[im 12/14]
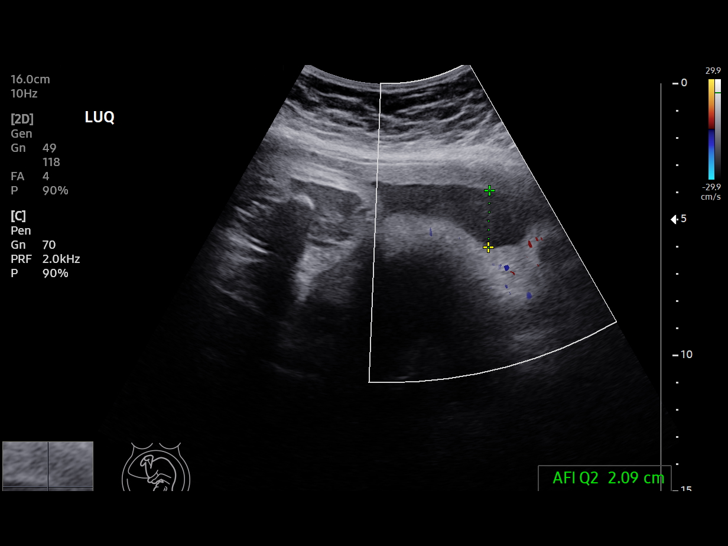
[im 13/14]
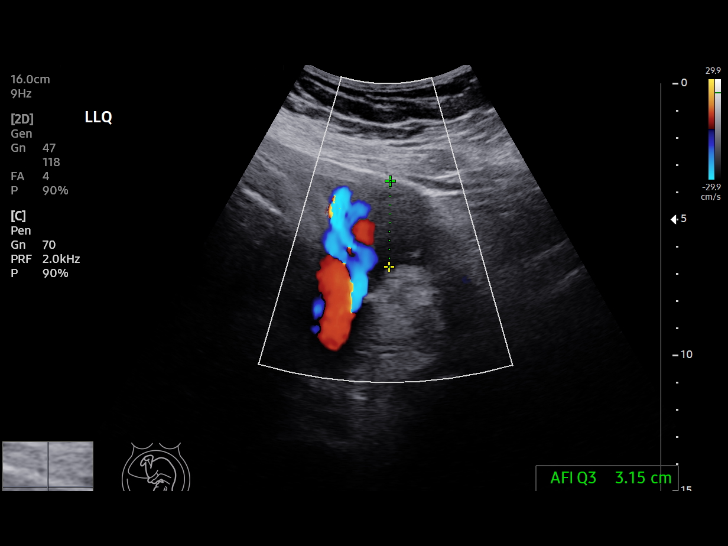
[im 14/14]
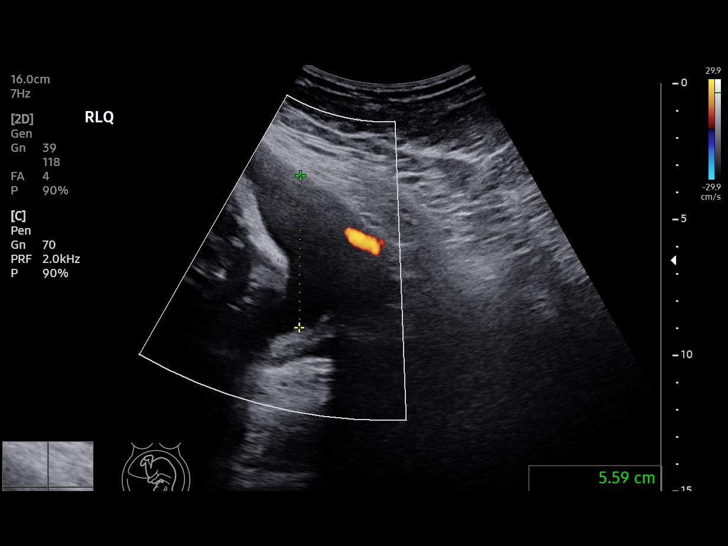

[13 of 14 positions shown; findings below may reference images not displayed]

Name:       SOHAYBE HAIZOUN                   Visit Date: 12/11/2020 [DATE]

                                                            Healthcare at
                   [id number]

 1  US FETAL BPP W/NONSTRESS              76818.4     LAKENDRA LIU

Service(s) Provided

Indications

 33 weeks gestation of pregnancy
 Gestational diabetes in pregnancy, insulin
 controlled
Fetal Evaluation

 Num Of Fetuses:         1
 Preg. Location:         Intrauterine
 Cardiac Activity:       Observed
 Presentation:           Cephalic

 Amniotic Fluid
 AFI FV:      Within normal limits

 AFI Sum(cm)     %Tile       Largest Pocket(cm)
 16.75           61

 RUQ(cm)       RLQ(cm)       LUQ(cm)        LLQ(cm)

Biophysical Evaluation
 Amniotic F.V:   Pocket => 2 cm             F. Tone:        Observed
 F. Movement:    Observed                   N.S.T:          Reactive
 F. Breathing:   Observed                   Score:          [DATE]
OB History

 Gravidity:    4         Term:   0         SAB:   3
 Living:       0
Gestational Age

 LMP:           33w 3d        Date:  04/21/20                 EDD:   01/26/21
 Best:          33w 3d     Det. By:  LMP  (04/21/20)          EDD:   01/26/21
Impression

 BPP [DATE]
 GDM A2
Recommendations

 -Continue weekly BPP till delivery.
                  Camille, Iveth

## 2022-08-13 ENCOUNTER — Encounter: Payer: Self-pay | Admitting: General Practice

## 2022-08-13 ENCOUNTER — Encounter: Payer: Self-pay | Admitting: *Deleted

## 2022-08-14 ENCOUNTER — Ambulatory Visit (INDEPENDENT_AMBULATORY_CARE_PROVIDER_SITE_OTHER): Payer: Medicaid Other | Admitting: Family Medicine

## 2022-08-14 VITALS — BP 120/74 | HR 71 | Wt 284.0 lb

## 2022-08-14 DIAGNOSIS — Z3A01 Less than 8 weeks gestation of pregnancy: Secondary | ICD-10-CM | POA: Diagnosis not present

## 2022-08-14 DIAGNOSIS — O039 Complete or unspecified spontaneous abortion without complication: Secondary | ICD-10-CM | POA: Diagnosis not present

## 2022-08-14 MED ORDER — ETONOGESTREL-ETHINYL ESTRADIOL 0.12-0.015 MG/24HR VA RING: VAGINAL_RING | VAGINAL | 3 refills | Status: DC

## 2022-08-14 MED ORDER — FLUCONAZOLE 150 MG PO TABS: 150.0000 mg | ORAL_TABLET | Freq: Once | ORAL | 0 refills | Status: AC

## 2022-08-14 NOTE — Progress Notes (Signed)
   Subjective:    Patient ID: Gina Lucas, female    DOB: 1996/01/15, 27 y.o.   MRN: [id number]  HPI  Patient seen for follow-up SAB.  She is not having any cramping.  She is not having any bleeding any further. Would like contraception.  Review of Systems     Objective:   Physical Exam Vitals reviewed.  Constitutional:      Appearance: Normal appearance.  Cardiovascular:     Rate and Rhythm: Normal rate.     Pulses: Normal pulses.  Pulmonary:     Effort: Pulmonary effort is normal.  Skin:    Capillary Refill: Capillary refill takes less than 2 seconds.  Neurological:     General: No focal deficit present.     Mental Status: She is alert.  Psychiatric:        Mood and Affect: Mood normal.        Behavior: Behavior normal.        Thought Content: Thought content normal.        Judgment: Judgment normal.       Assessment & Plan:  1. Spontaneous abortion Discussed RPL. She would like contraception. Not sure about BTL/salpingectomy. Initially wanted patches, but at increased risk of blood clots. Will trial nuvaring - she has used it in the past. She can take it out for an hour for sex, then needs to replace it.

## 2022-08-14 NOTE — Progress Notes (Signed)
Pt stopped bleeding around 07/14/22 but has not had a menses since.

## 2022-08-19 DIAGNOSIS — Z419 Encounter for procedure for purposes other than remedying health state, unspecified: Secondary | ICD-10-CM | POA: Diagnosis not present

## 2022-09-02 IMAGING — US US MFM FETAL BPP W/O NON-STRESS
1 series · 12 of 28 positions shown · non-contrast
Comparison: none

[Series 1: us mfm fetal bpp w/o non-stress · 42 acquisitions, 12 frames shown]
[im 2/42]
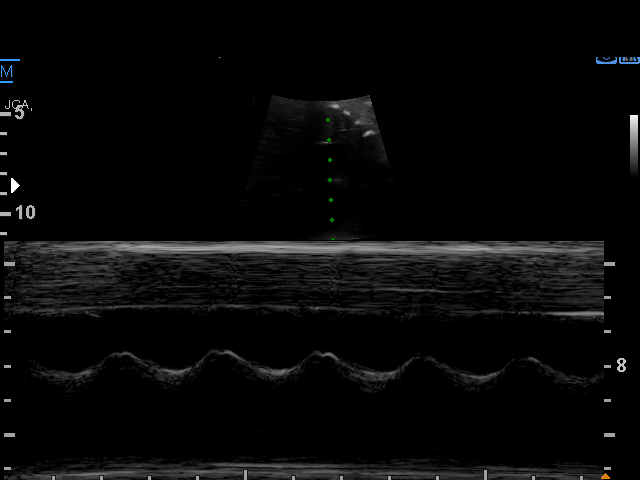
[im 5/42]
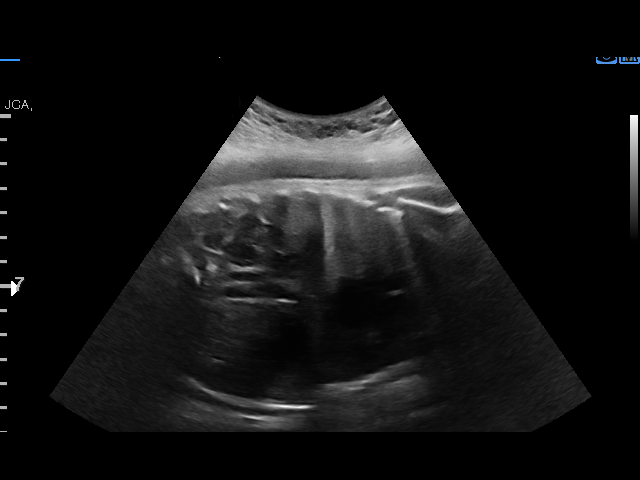
[im 8/42]
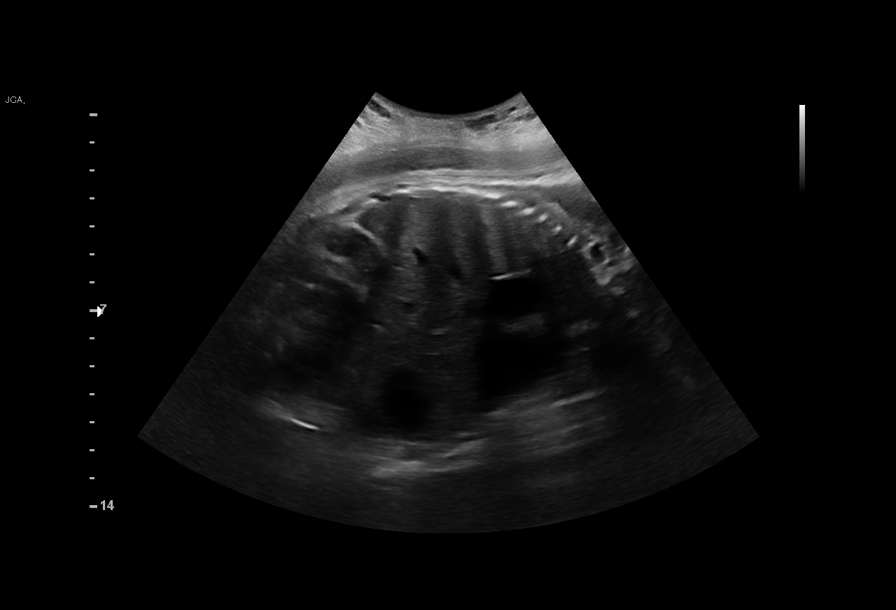
[im 13/42]
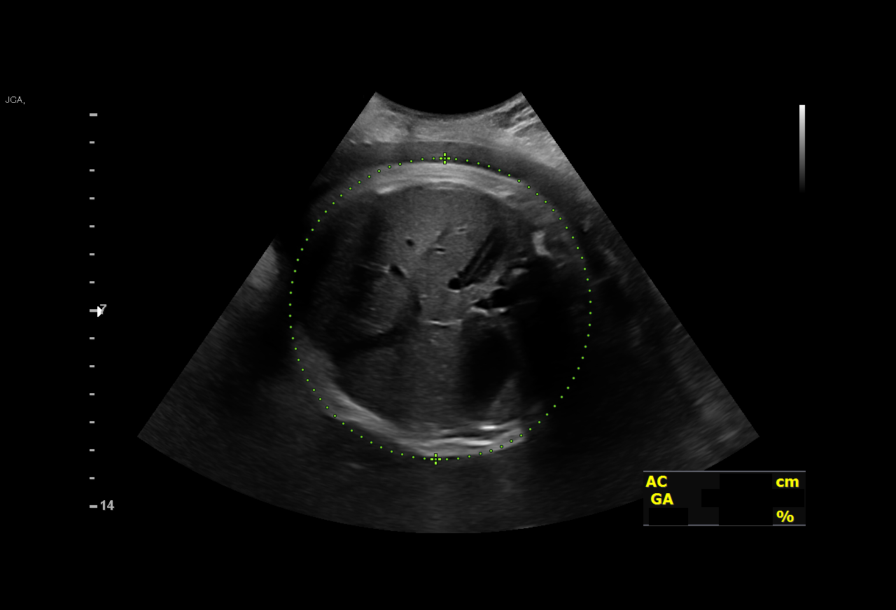
[im 16/42]
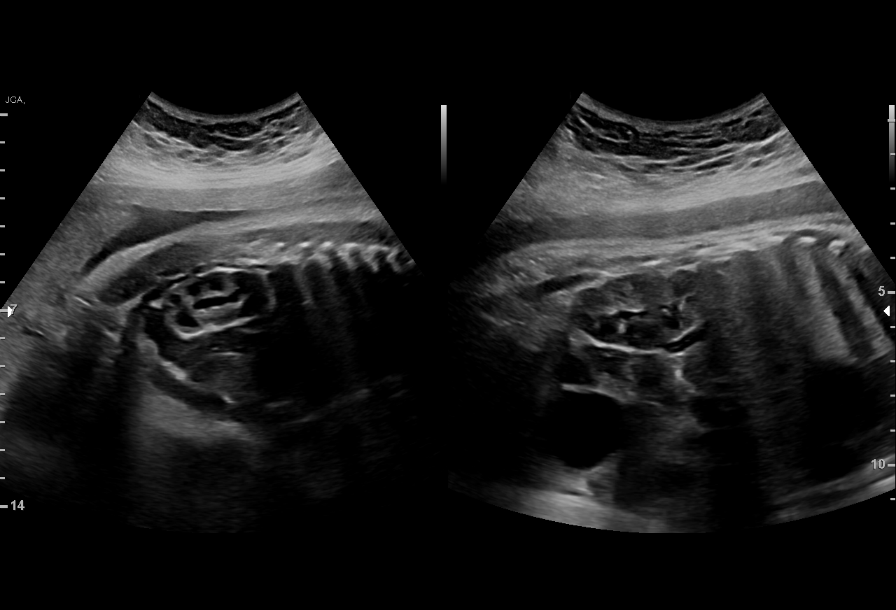
[im 19/42]
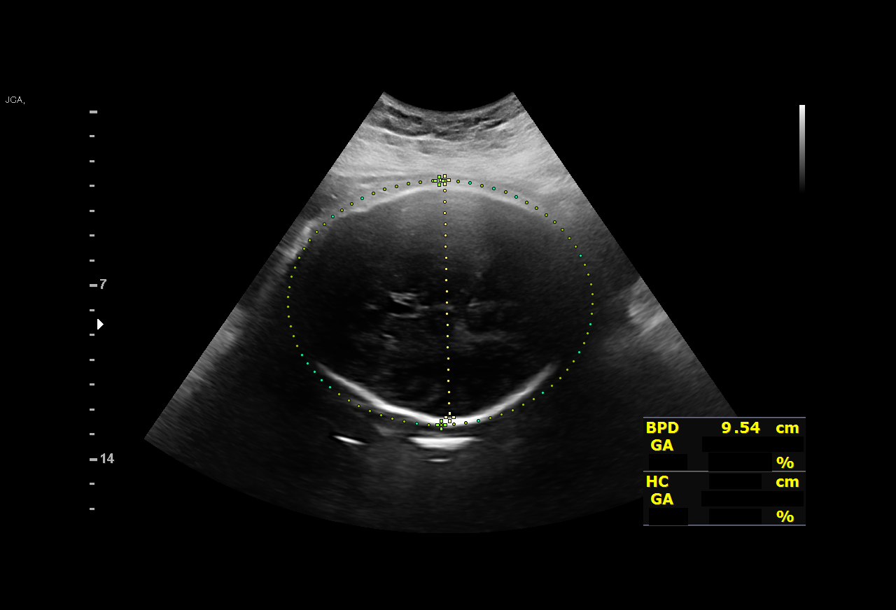
[im 23/42]
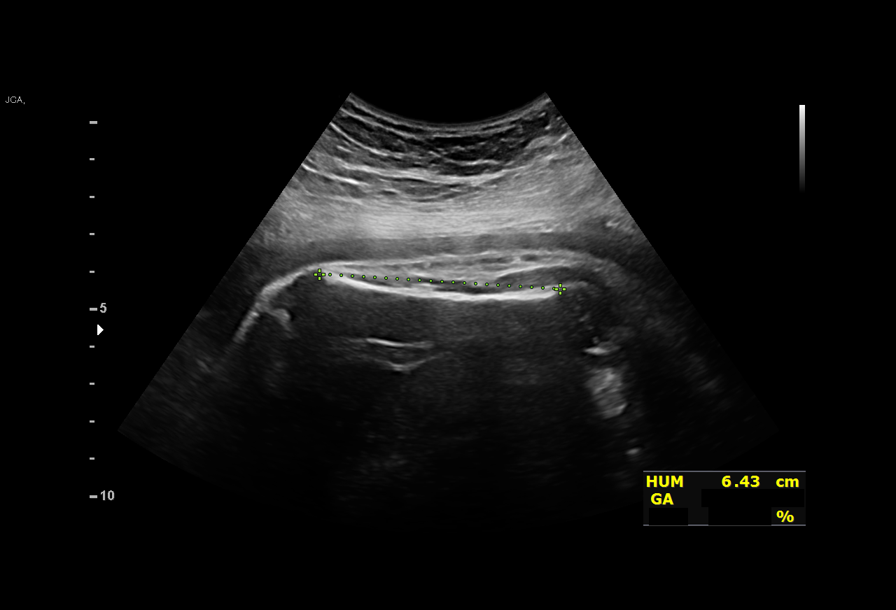
[im 26/42]
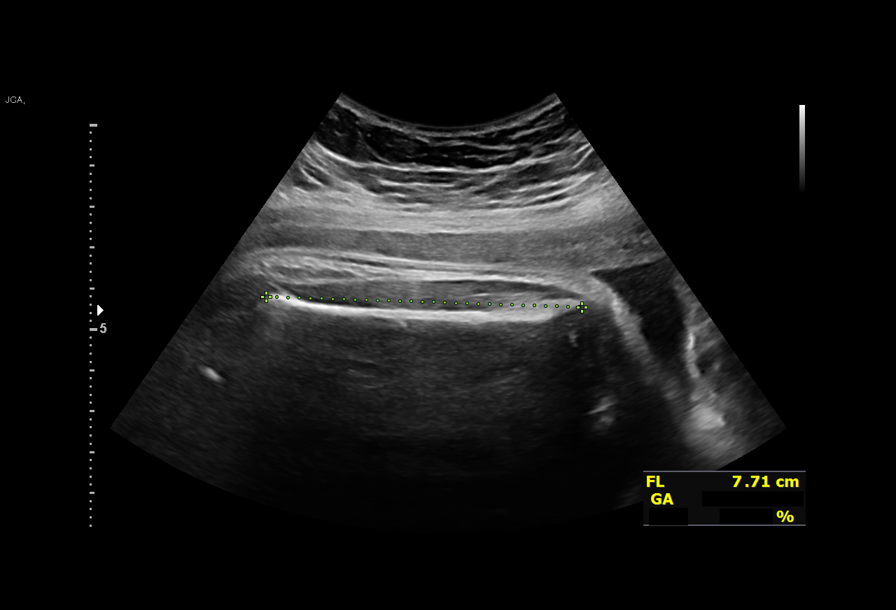
[im 29/42]
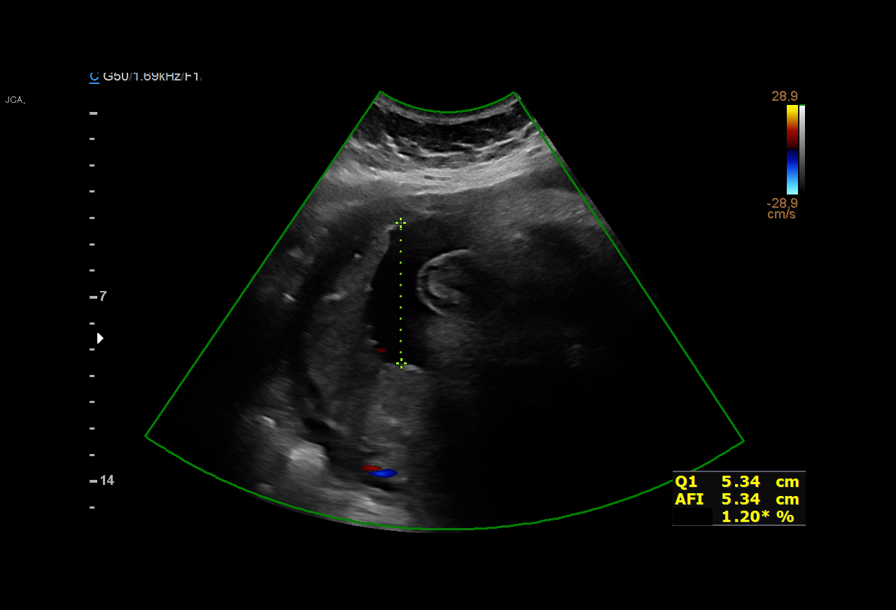
[im 34/42]
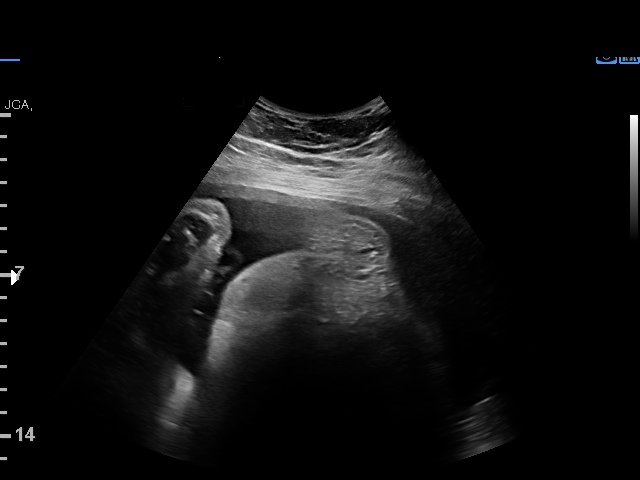
[im 37/42]
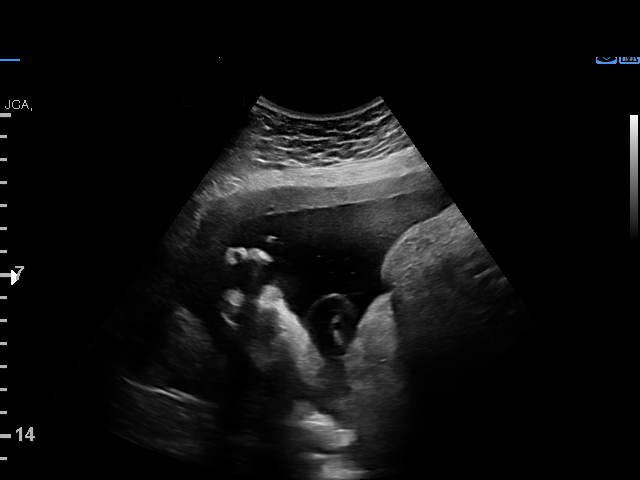
[im 40/42]
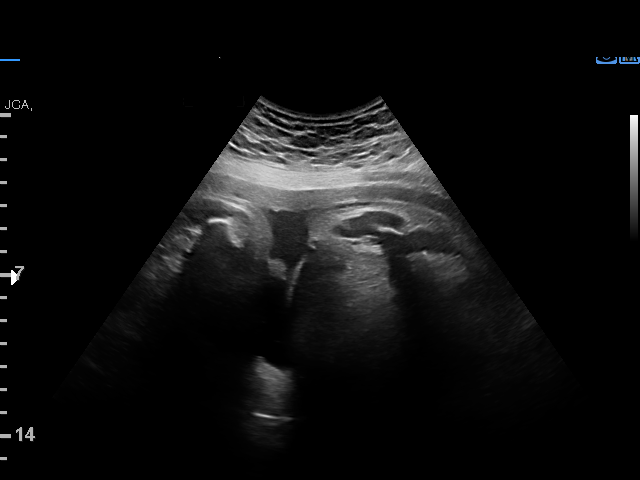

[12 of 28 positions shown; findings below may reference images not displayed]

[id number]

Indications

 Gestational diabetes in pregnancy, insulin
 controlled
 Obesity complicating pregnancy, second
 trimester (BMI 38)
 Poor obstetric history-Recurrent (habitual)
 abortion (3 consecutive ab's)
 Encounter for other antenatal screening
 follow-up
 36 weeks gestation of pregnancy
 Low Risk NIPS

## 2022-09-18 DIAGNOSIS — Z419 Encounter for procedure for purposes other than remedying health state, unspecified: Secondary | ICD-10-CM | POA: Diagnosis not present

## 2022-10-19 DIAGNOSIS — Z419 Encounter for procedure for purposes other than remedying health state, unspecified: Secondary | ICD-10-CM | POA: Diagnosis not present

## 2022-10-28 ENCOUNTER — Other Ambulatory Visit: Payer: Self-pay

## 2022-10-28 MED ORDER — ETONOGESTREL-ETHINYL ESTRADIOL 0.12-0.015 MG/24HR VA RING: VAGINAL_RING | VAGINAL | 4 refills | Status: DC

## 2022-11-18 DIAGNOSIS — Z419 Encounter for procedure for purposes other than remedying health state, unspecified: Secondary | ICD-10-CM | POA: Diagnosis not present

## 2022-11-19 DIAGNOSIS — F4325 Adjustment disorder with mixed disturbance of emotions and conduct: Secondary | ICD-10-CM | POA: Diagnosis not present

## 2022-12-19 DIAGNOSIS — Z419 Encounter for procedure for purposes other than remedying health state, unspecified: Secondary | ICD-10-CM | POA: Diagnosis not present

## 2022-12-27 DIAGNOSIS — F4325 Adjustment disorder with mixed disturbance of emotions and conduct: Secondary | ICD-10-CM | POA: Diagnosis not present

## 2023-01-16 DIAGNOSIS — F4325 Adjustment disorder with mixed disturbance of emotions and conduct: Secondary | ICD-10-CM | POA: Diagnosis not present

## 2023-01-19 DIAGNOSIS — Z419 Encounter for procedure for purposes other than remedying health state, unspecified: Secondary | ICD-10-CM | POA: Diagnosis not present

## 2023-01-31 DIAGNOSIS — Z9149 Other personal history of psychological trauma, not elsewhere classified: Secondary | ICD-10-CM | POA: Diagnosis not present

## 2023-01-31 DIAGNOSIS — F4325 Adjustment disorder with mixed disturbance of emotions and conduct: Secondary | ICD-10-CM | POA: Diagnosis not present

## 2023-02-14 DIAGNOSIS — F4325 Adjustment disorder with mixed disturbance of emotions and conduct: Secondary | ICD-10-CM | POA: Diagnosis not present

## 2023-02-14 DIAGNOSIS — Z9149 Other personal history of psychological trauma, not elsewhere classified: Secondary | ICD-10-CM | POA: Diagnosis not present

## 2023-02-18 DIAGNOSIS — Z419 Encounter for procedure for purposes other than remedying health state, unspecified: Secondary | ICD-10-CM | POA: Diagnosis not present

## 2023-02-27 DIAGNOSIS — F4325 Adjustment disorder with mixed disturbance of emotions and conduct: Secondary | ICD-10-CM | POA: Diagnosis not present

## 2023-03-14 DIAGNOSIS — F4325 Adjustment disorder with mixed disturbance of emotions and conduct: Secondary | ICD-10-CM | POA: Diagnosis not present

## 2023-03-18 DIAGNOSIS — Z113 Encounter for screening for infections with a predominantly sexual mode of transmission: Secondary | ICD-10-CM | POA: Diagnosis not present

## 2023-03-21 DIAGNOSIS — Z419 Encounter for procedure for purposes other than remedying health state, unspecified: Secondary | ICD-10-CM | POA: Diagnosis not present

## 2023-03-28 DIAGNOSIS — Z9149 Other personal history of psychological trauma, not elsewhere classified: Secondary | ICD-10-CM | POA: Diagnosis not present

## 2023-03-28 DIAGNOSIS — F4325 Adjustment disorder with mixed disturbance of emotions and conduct: Secondary | ICD-10-CM | POA: Diagnosis not present

## 2023-04-11 DIAGNOSIS — F4325 Adjustment disorder with mixed disturbance of emotions and conduct: Secondary | ICD-10-CM | POA: Diagnosis not present

## 2023-04-11 DIAGNOSIS — Z9149 Other personal history of psychological trauma, not elsewhere classified: Secondary | ICD-10-CM | POA: Diagnosis not present

## 2023-04-20 DIAGNOSIS — Z419 Encounter for procedure for purposes other than remedying health state, unspecified: Secondary | ICD-10-CM | POA: Diagnosis not present

## 2023-04-25 DIAGNOSIS — Z9149 Other personal history of psychological trauma, not elsewhere classified: Secondary | ICD-10-CM | POA: Diagnosis not present

## 2023-04-25 DIAGNOSIS — F4325 Adjustment disorder with mixed disturbance of emotions and conduct: Secondary | ICD-10-CM | POA: Diagnosis not present

## 2023-05-09 DIAGNOSIS — Z9149 Other personal history of psychological trauma, not elsewhere classified: Secondary | ICD-10-CM | POA: Diagnosis not present

## 2023-05-09 DIAGNOSIS — F4325 Adjustment disorder with mixed disturbance of emotions and conduct: Secondary | ICD-10-CM | POA: Diagnosis not present

## 2023-05-21 DIAGNOSIS — Z419 Encounter for procedure for purposes other than remedying health state, unspecified: Secondary | ICD-10-CM | POA: Diagnosis not present

## 2023-06-06 DIAGNOSIS — F4325 Adjustment disorder with mixed disturbance of emotions and conduct: Secondary | ICD-10-CM | POA: Diagnosis not present

## 2023-06-21 DIAGNOSIS — Z419 Encounter for procedure for purposes other than remedying health state, unspecified: Secondary | ICD-10-CM | POA: Diagnosis not present

## 2023-07-19 DIAGNOSIS — Z419 Encounter for procedure for purposes other than remedying health state, unspecified: Secondary | ICD-10-CM | POA: Diagnosis not present

## 2023-08-24 DIAGNOSIS — Z833 Family history of diabetes mellitus: Secondary | ICD-10-CM | POA: Diagnosis not present

## 2023-08-24 DIAGNOSIS — Z8249 Family history of ischemic heart disease and other diseases of the circulatory system: Secondary | ICD-10-CM | POA: Diagnosis not present

## 2023-08-24 DIAGNOSIS — Z87891 Personal history of nicotine dependence: Secondary | ICD-10-CM | POA: Diagnosis not present

## 2023-08-24 DIAGNOSIS — R030 Elevated blood-pressure reading, without diagnosis of hypertension: Secondary | ICD-10-CM | POA: Diagnosis not present

## 2023-08-24 DIAGNOSIS — Z6841 Body Mass Index (BMI) 40.0 and over, adult: Secondary | ICD-10-CM | POA: Diagnosis not present

## 2023-08-24 DIAGNOSIS — Z809 Family history of malignant neoplasm, unspecified: Secondary | ICD-10-CM | POA: Diagnosis not present

## 2023-08-30 DIAGNOSIS — Z419 Encounter for procedure for purposes other than remedying health state, unspecified: Secondary | ICD-10-CM | POA: Diagnosis not present

## 2023-09-29 DIAGNOSIS — Z419 Encounter for procedure for purposes other than remedying health state, unspecified: Secondary | ICD-10-CM | POA: Diagnosis not present

## 2023-10-11 ENCOUNTER — Other Ambulatory Visit: Payer: Self-pay | Admitting: Family Medicine

## 2023-10-30 DIAGNOSIS — Z419 Encounter for procedure for purposes other than remedying health state, unspecified: Secondary | ICD-10-CM | POA: Diagnosis not present

## 2023-11-29 DIAGNOSIS — Z419 Encounter for procedure for purposes other than remedying health state, unspecified: Secondary | ICD-10-CM | POA: Diagnosis not present

## 2023-12-02 ENCOUNTER — Ambulatory Visit

## 2023-12-02 ENCOUNTER — Other Ambulatory Visit (HOSPITAL_COMMUNITY)
Admission: RE | Admit: 2023-12-02 | Discharge: 2023-12-02 | Disposition: A | Discharge status: Home / Self Care | Source: Ambulatory Visit | Attending: Obstetrics and Gynecology | Admitting: Obstetrics and Gynecology

## 2023-12-02 ENCOUNTER — Other Ambulatory Visit: Payer: Self-pay

## 2023-12-02 DIAGNOSIS — O0990 Supervision of high risk pregnancy, unspecified, unspecified trimester: Secondary | ICD-10-CM

## 2023-12-02 LAB — POCT URINE PREGNANCY: Preg Test, Ur: POSITIVE — AB
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
## 2023-12-03 ENCOUNTER — Other Ambulatory Visit: Payer: Self-pay

## 2023-12-03 ENCOUNTER — Inpatient Hospital Stay (HOSPITAL_COMMUNITY)
Admission: AD | Admit: 2023-12-03 | Discharge: 2023-12-03 | Disposition: A | Discharge status: Home / Self Care | Attending: Obstetrics and Gynecology | Admitting: Obstetrics and Gynecology

## 2023-12-03 ENCOUNTER — Inpatient Hospital Stay (HOSPITAL_COMMUNITY)

## 2023-12-03 DIAGNOSIS — O209 Hemorrhage in early pregnancy, unspecified: Secondary | ICD-10-CM

## 2023-12-03 DIAGNOSIS — Z3A09 9 weeks gestation of pregnancy: Secondary | ICD-10-CM | POA: Insufficient documentation

## 2023-12-03 DIAGNOSIS — B3731 Acute candidiasis of vulva and vagina: Secondary | ICD-10-CM

## 2023-12-03 DIAGNOSIS — Z3A10 10 weeks gestation of pregnancy: Secondary | ICD-10-CM

## 2023-12-03 DIAGNOSIS — Z3491 Encounter for supervision of normal pregnancy, unspecified, first trimester: Secondary | ICD-10-CM

## 2023-12-03 DIAGNOSIS — Z6841 Body Mass Index (BMI) 40.0 and over, adult: Secondary | ICD-10-CM

## 2023-12-03 DIAGNOSIS — R102 Pelvic and perineal pain: Secondary | ICD-10-CM | POA: Diagnosis present

## 2023-12-03 DIAGNOSIS — O99211 Obesity complicating pregnancy, first trimester: Secondary | ICD-10-CM | POA: Diagnosis not present

## 2023-12-03 DIAGNOSIS — Z113 Encounter for screening for infections with a predominantly sexual mode of transmission: Secondary | ICD-10-CM | POA: Diagnosis present

## 2023-12-03 DIAGNOSIS — O98811 Other maternal infectious and parasitic diseases complicating pregnancy, first trimester: Secondary | ICD-10-CM | POA: Diagnosis not present

## 2023-12-03 LAB — WET PREP, GENITAL
Clue Cells Wet Prep HPF POC: NONE SEEN
Sperm: NONE SEEN
Trich, Wet Prep: NONE SEEN
WBC, Wet Prep HPF POC: >=10 — AB (ref <10)

## 2023-12-03 LAB — CERVICOVAGINAL ANCILLARY ONLY
Chlamydia: NEGATIVE
Comment: NEGATIVE
Comment: NORMAL
Neisseria Gonorrhea: NEGATIVE

## 2023-12-03 LAB — CBC
HCT: 38.9 % (ref 36.0–46.0)
Hemoglobin: 13 g/dL (ref 12.0–15.0)
MCH: 29.1 pg (ref 26.0–34.0)
MCHC: 33.4 g/dL (ref 30.0–36.0)
MCV: 87 fL (ref 80.0–100.0)
Platelets: 227 K/uL (ref 150–400)
RBC: 4.47 MIL/uL (ref 3.87–5.11)
RDW: 12.1 % (ref 11.5–15.5)
WBC: 7.9 K/uL (ref 4.0–10.5)
nRBC: 0 % (ref 0.0–0.2)

## 2023-12-03 LAB — CBC/D/PLT+RPR+RH+ABO+RUBIGG...
Antibody Screen: NEGATIVE
Basophils Absolute: 0.1 x10E3/uL (ref 0.0–0.2)
Basos: 1 %
EOS (ABSOLUTE): 0.1 x10E3/uL (ref 0.0–0.4)
Eos: 2 %
HCV Ab: NONREACTIVE
HIV Screen 4th Generation wRfx: NONREACTIVE
Hematocrit: 41 % (ref 34.0–46.6)
Hemoglobin: 13.8 g/dL (ref 11.1–15.9)
Hepatitis B Surface Ag: NEGATIVE
Immature Grans (Abs): 0 x10E3/uL (ref 0.0–0.1)
Immature Granulocytes: 0 %
Lymphocytes Absolute: 3.1 x10E3/uL (ref 0.7–3.1)
Lymphs: 42 %
MCH: 30.3 pg (ref 26.6–33.0)
MCHC: 33.7 g/dL (ref 31.5–35.7)
MCV: 90 fL (ref 79–97)
Monocytes Absolute: 0.6 x10E3/uL (ref 0.1–0.9)
Monocytes: 9 %
Neutrophils Absolute: 3.4 x10E3/uL (ref 1.4–7.0)
Neutrophils: 46 %
Platelets: 233 x10E3/uL (ref 150–450)
RBC: 4.55 x10E6/uL (ref 3.77–5.28)
RDW: 12.3 % (ref 11.7–15.4)
RPR Ser Ql: NONREACTIVE
Rh Factor: POSITIVE
Rubella Antibodies, IGG: <0.9 {index} — ABNORMAL LOW (ref >0.99)
WBC: 7.3 x10E3/uL (ref 3.4–10.8)

## 2023-12-03 LAB — HCV INTERPRETATION

## 2023-12-03 LAB — GC/CHLAMYDIA PROBE AMP (~~LOC~~) NOT AT ARMC
Chlamydia: NEGATIVE
Comment: NEGATIVE
Comment: NORMAL
Neisseria Gonorrhea: NEGATIVE

## 2023-12-03 LAB — URINALYSIS, ROUTINE W REFLEX MICROSCOPIC
Bilirubin Urine: NEGATIVE
Glucose, UA: >=500 mg/dL — AB
Ketones, ur: 5 mg/dL — AB
Nitrite: NEGATIVE
Protein, ur: NEGATIVE mg/dL
Specific Gravity, Urine: 1.026 (ref 1.005–1.030)
pH: 6 (ref 5.0–8.0)

## 2023-12-03 LAB — HCG, QUANTITATIVE, PREGNANCY: hCG, Beta Chain, Quant, S: 48236 m[IU]/mL — ABNORMAL HIGH (ref <5)

## 2023-12-03 MED ORDER — TERCONAZOLE 0.8 % VA CREA: 1.0000 | TOPICAL_CREAM | Freq: Every day | VAGINAL | 0 refills | Status: DC

## 2023-12-03 NOTE — Discharge Instructions (Signed)
Commonly Asked Questions During Pregnancy  How Will I Feel When I'm Pregnant? Pregnancy symptoms in the first trimester of pregnancy may not appear until the middle or end of the second month. Hormonal changes will cause tenderness in your breasts, and you may begin to feel more tired than usual. Food cravings, an increase in the need to urinate, and morning sickness may all be more noticeable.  Pregnancy symptoms in the second trimester are more prominent. You may start to feel the baby move and become more active. Dental issues, nasal/sinus problems, and skin irritations can begin to appear. Heartburn, leg cramps, dizziness, and a vaginal discharge are also common. Every woman is different when it comes to the symptoms they experience, and some may not experience any at all. Pregnancy symptoms in the third trimester can include increased frequency in urination, leg cramps, constipation, ligament pain in the abdomen, and weight gain. Back pain and Braxton Hicks contractions will become increasingly more common.  Why is nutrition during pregnancy important? Eating well is one of the best things you can do during pregnancy. Good nutrition helps you handle the extra demands on your body as your pregnancy progresses. The goal is to balance getting enough nutrients to support the growth of your fetus and maintaining a healthy weight.  How much water should I drink during pregnancy? During pregnancy you should drink 8 to 12 cups (64 to 96 ounces) of water every day. Water has many benefits. It aids digestion and helps form the amniotic fluid around the fetus. Water also helps nutrients circulate in the body and helps waste leave the body.  What can I do to help with nausea? Eat dry toast or crackers in the morning before you get out of bed to avoid moving around on an empty stomach. Eat five or six "mini meals" a day to ensure that your stomach is never empty. Eat frequent bites of foods like nuts,  fruits, or crackers.  What can help with constipation during pregnancy? Constipation is common near the end of pregnancy. Eating more foods with fiber can help fight constipation. Fiber is found in fruits, vegetables, whole grains, beans, nuts, and seeds. You should aim for about 25 grams of fiber in your diet each day. Drink a lot of water as you increase your fiber intake.  How much coffee can I drink while I'm pregnant? Research suggests that moderate caffeine consumption (less than 200 milligrams per day) does not cause miscarriage or preterm birth. That's the amount in one 12-ounce cup of coffee. Remember that caffeine also is found in tea, chocolate, energy drinks, and soft drinks. Caffeine can interfere with sleep and contribute to nausea and light-headedness. Caffeine also can increase urination and lead to dehydration.  What can I do to prevent or ease back pain during pregnancy? There are several things you can do to prevent or ease back pain. For example, wear supportive clothing and shoes. Pay attention to your position when sitting, sleeping, and lifting things. If you need to stand for a long time, rest one foot on a stool or a box to take the strain off your back. You also can use heat or cold to soothe sore muscles.  Is it safe to exercise during pregnancy? If you are healthy and your pregnancy is normal, it is safe to continue or start regular physical activity. Physical activity does not increase your risk of miscarriage, low birth weight, or early delivery. It's still important to discuss exercise with your ob-gyn  provider during your early prenatal visits.   What are the benefits of exercise during pregnancy? Regular exercise during pregnancy benefits you and your fetus in these key ways: Reduces back pain Eases constipation May decrease your risk of gestational diabetes, preeclampsia, and cesarean birth Promotes healthy weight gain during pregnancy Improves your overall  fitness and strengthens your heart and blood vessels Helps you to lose the baby weight after your baby is born  Is it safe to dye my hair during pregnancy? Yes, it's safe. Only a small amount of chemicals from hair dye is absorbed through the scalp.  Is it safe to keep a cat during pregnancy? Yes, you can keep your cat. You may have heard that cat feces can carry the infection toxoplasmosis. This infection is only found in cats who go outdoors and hunt prey, such as mice and other rodents. If you do have a cat who goes outdoors or eats prey, have someone else take over daily cleaning the litter box. This will keep you away from any cat feces. If you have an indoor cat who only eats cat food and doesn't have contact with outside animals, your risk of toxoplasmosis is very low.  What substances should I avoid during pregnancy? During pregnancy, women should not use tobacco, alcohol, marijuana, illegal drugs, or prescription medications for nonmedical reasons. Avoiding these substances and getting regular prenatal care are important to having a healthy pregnancy and a healthy baby.   What foods to I need to avoid in pregnancy? To help prevent listeriosis, avoid eating the following foods while you are pregnant: Unpasteurized milk and foods made with unpasteurized milk, including soft cheeses Hot dogs and luncheon meats, unless they are heated until steaming hot just before serving Unwashed raw produce such as fruits and vegetables  Avoid all raw and undercooked seafood, eggs, meat, and poultry while you are pregnant. Do not eat sushi made with raw fish (cooked sushi is safe). Cooking and pasteurization are the only ways to kill Listeria.  Limit your exposure to mercury by not eating bigeye tuna, king mackerel, marlin, orange roughy, shark, swordfish, or tilefish. Limit eating white (albacore) tuna to 6 ounces a week. You do not have to avoid all fish during pregnancy. In fact, fish and shellfish are  nutritious foods with vital nutrients for a pregnant woman and her fetus. Be sure to eat at least 8-12 ounces of low-mercury fish and shellfish per week.  Is travel safe to during pregnancy? In most cases, pregnant women can travel safely until close to their due dates. But travel may not be recommended for women who have pregnancy complications. If you are planning a trip, talk with your (ob-gyn) provider. And no matter how you choose to travel, think ahead about your comfort and safety.  Can I use a sauna or hot tub early in pregnancy? It's best not to. Your core body temperature rises when you use saunas and hot tubs. This rise in temperature can be harmful for your fetus.  Can I get a massage while pregnant? Yes. Massage is a good way to relax and improve circulation. The best position for a massage while you're pregnant is lying on your side, rather than facedown. Some massage tables have a cut-out for the belly, allowing you to lie facedown comfortably. Tell your massage therapist that you're pregnant if you're not showing yet. Many health spas offer special prenatal massages done by therapists who are trained to work on pregnant women.  Is Having Dental  Work While Pregnant Safe? Pregnancy and dental work questions are common for expecting moms. Preventive dental cleanings and annual exams during pregnancy are not only safe but are recommended. The rise in hormone levels during pregnancy causes the gums to swell, bleed, and trap food causing increased irritation to your gums. Preventive dental work while pregnant is essential to avoid oral infections such as gum disease, which has been linked to preterm birth. The American Dental Association (ADA) recommends pregnant women eat a balanced diet, brush their teeth thoroughly with ADA-approved fluoride toothpaste twice a day, and floss daily. Have preventive exams and cleanings during your pregnancy. Let your dentist know you are  pregnant. Postpone non-emergency dental work until the second trimester or after delivery, if possible. Elective procedures should be postponed until after the delivery.  

## 2023-12-03 NOTE — MAU Provider Note (Signed)
 [name] is a 28 y.o. [phone] patient who presents to MAU today with complaint of pelvic pain and pressure with light vaginal bleeding that began this morning @ ~10.1 GA. NOB Nurse  visit completed yesterday at [facility] office. Denies active VB   O BP 139/78 (BP Location: Right Arm)   Pulse 90   Temp 97.8 F (36.6 C) (Oral)   Resp 19   Ht 5' 7 (1.702 m)   Wt (!) 137.9 kg   LMP 09/23/2023   SpO2 99%   BMI 47.63 kg/m  Physical Exam Vitals and nursing note reviewed.  Constitutional:      General: She is not in acute distress.    Appearance: Normal appearance. She is obese. She is not ill-appearing.  HENT:     Head: Normocephalic.     Nose: Nose normal.     Mouth/Throat:     Mouth: Mucous membranes are moist.  Cardiovascular:     Rate and Rhythm: Normal rate.  Pulmonary:     Effort: Pulmonary effort is normal.  Abdominal:     Palpations: Abdomen is soft.  Musculoskeletal:        General: Normal range of motion.     Cervical back: Normal range of motion.  Neurological:     Mental Status: She is alert and oriented to person, place, and time.  Psychiatric:        Mood and Affect: Mood normal.        Behavior: Behavior normal.     MDM  HIGH  Vaginal bleeding in early pregnacy CBC: Unremarkable HCG Quant: 48,236 ABO: O Positive OB Ultrasound ( IUP confirmed- official report to follow) Vaginal Swabs ( Yeast- Will discharge with RX for Terezol) UA: Large leucocytes, neg nitrates - likely contaminated  ( Will send for CX)   Differential diagnosis considered for 1st trimester vaginal bleeding includes but is not limited to: ectopic pregnancy, complete spontaneous abortion, incomplete abortion, missed abortion, threatened abortion, embryonic/fetal demise, cervical insufficiency, cervical or vaginal disorder    Orders Placed This Encounter  Procedures   Wet prep, genital    Standing Status:   Standing    Number of Occurrences:   1   Culture, OB Urine     Standing Status:   Standing    Number of Occurrences:   1   US  OB Comp Less 14 Wks    Standing Status:   Standing    Number of Occurrences:   1    Symptom/Reason for Exam:   Vaginal bleeding [[id number]]   Urinalysis, Routine w reflex microscopic -Urine, Clean Catch    Standing Status:   Standing    Number of Occurrences:   1    Specimen Source:   Urine, Clean Catch [76]   CBC    Standing Status:   Standing    Number of Occurrences:   1   hCG, quantitative, pregnancy    Standing Status:   Standing    Number of Occurrences:   1   Discharge patient Discharge disposition: 01-Home or Self Care; Discharge patient date: 12/03/2023    Standing Status:   Standing    Number of Occurrences:   1    Discharge disposition:   01-Home or Self Care [1]    Discharge patient date:   12/03/2023      Results for orders placed or performed during the hospital encounter of 12/03/23 (from the past 24 hours)  Urinalysis, Routine w reflex microscopic -Urine, Clean  Catch     Status: Abnormal   Collection Time: 12/03/23 11:24 AM  Result Value Ref Range   Color, Urine YELLOW YELLOW   APPearance HAZY (A) CLEAR   Specific Gravity, Urine 1.026 1.005 - 1.030   pH 6.0 5.0 - 8.0   Glucose, UA >=500 (A) NEGATIVE mg/dL   Hgb urine dipstick SMALL (A) NEGATIVE   Bilirubin Urine NEGATIVE NEGATIVE   Ketones, ur 5 (A) NEGATIVE mg/dL   Protein, ur NEGATIVE NEGATIVE mg/dL   Nitrite NEGATIVE NEGATIVE   Leukocytes,Ua LARGE (A) NEGATIVE   RBC / HPF 0-5 0 - 5 RBC/hpf   WBC, UA 6-10 0 - 5 WBC/hpf   Bacteria, UA RARE (A) NONE SEEN   Squamous Epithelial / HPF 11-20 0 - 5 /HPF   Mucus PRESENT   Wet prep, genital     Status: Abnormal   Collection Time: 12/03/23 12:28 PM   Specimen: PATH Cytology Cervicovaginal Ancillary Only  Result Value Ref Range   Yeast Wet Prep HPF POC PRESENT (A) NONE SEEN   Trich, Wet Prep NONE SEEN NONE SEEN   Clue Cells Wet Prep HPF POC NONE SEEN NONE SEEN   WBC, Wet Prep HPF POC >=10 (A) <10    Sperm NONE SEEN   CBC     Status: None   Collection Time: 12/03/23  1:28 PM  Result Value Ref Range   WBC 7.9 4.0 - 10.5 K/uL   RBC 4.47 3.87 - 5.11 MIL/uL   Hemoglobin 13.0 12.0 - 15.0 g/dL   HCT 61.0 63.9 - 53.9 %   MCV 87.0 80.0 - 100.0 fL   MCH 29.1 26.0 - 34.0 pg   MCHC 33.4 30.0 - 36.0 g/dL   RDW 87.8 88.4 - 84.4 %   Platelets 227 150 - 400 K/uL   nRBC 0.0 0.0 - 0.2 %  hCG, quantitative, pregnancy     Status: Abnormal   Collection Time: 12/03/23  1:28 PM  Result Value Ref Range   hCG, Beta Chain, Quant, S 48,236 (H) <5 mIU/mL      I have reviewed the patient chart and performed the physical exam . I have ordered & interpreted the lab results and reviewed and interpreted the ultrasound images  Medications ordered as stated below.  A/P as described below.  Counseling and education provided and patient agreeable  with plan as described below. Verbalized understanding.    ASSESSMENT Medical screening exam complete  1. Normal intrauterine pregnancy on prenatal ultrasound in first trimester  2. Vaginal bleeding affecting early pregnancy (Primary)  3. Yeast vaginitis  4. Obesity affecting pregnancy in first trimester, unspecified obesity type  5. BMI 45.0-49.9, adult [facility])     PLAN Future Appointments  Date Time Provider Department Center  12/15/2023  3:15 PM [facility] US2 [facility]  12/24/2023  8:35 AM Stinson, Jacob J, DO [facility] None    Discharge from MAU in stable condition  Follow up with your OB provider   See AVS for full description of educational information and instructions provided to the patient at time of discharge   Warning signs for worsening condition that would warrant emergency follow-up discussed Patient may return to MAU as needed   Littie Olam LABOR, NP 12/03/2023 5:07 PM

## 2023-12-03 NOTE — MAU Note (Signed)
 Gina Lucas is a 28 y.o. at [redacted]w[redacted]d here in MAU reporting: she's having pelvic pain & pressure and light bleeding that began this morning.  Denies recent intercourse.      LMP: 09/23/2023 Onset of complaint: today Pain score: 4 Vitals:   12/03/23 1120  BP: 139/78  Pulse: 90  Resp: 19  Temp: 97.8 F (36.6 C)  SpO2: 99%     FHT:   Lab orders placed from triage: UA

## 2023-12-04 ENCOUNTER — Ambulatory Visit: Payer: Self-pay | Admitting: Obstetrics and Gynecology

## 2023-12-04 ENCOUNTER — Encounter: Payer: Self-pay | Admitting: Obstetrics and Gynecology

## 2023-12-04 DIAGNOSIS — Z2839 Other underimmunization status: Secondary | ICD-10-CM | POA: Insufficient documentation

## 2023-12-05 LAB — CULTURE, OB URINE

## 2023-12-05 LAB — URINE CULTURE, OB REFLEX

## 2023-12-15 ENCOUNTER — Other Ambulatory Visit

## 2023-12-15 ENCOUNTER — Other Ambulatory Visit: Payer: Self-pay

## 2023-12-15 DIAGNOSIS — O0991 Supervision of high risk pregnancy, unspecified, first trimester: Secondary | ICD-10-CM

## 2023-12-15 DIAGNOSIS — Z3A11 11 weeks gestation of pregnancy: Secondary | ICD-10-CM | POA: Diagnosis not present

## 2023-12-15 DIAGNOSIS — O0990 Supervision of high risk pregnancy, unspecified, unspecified trimester: Secondary | ICD-10-CM

## 2023-12-24 ENCOUNTER — Ambulatory Visit (INDEPENDENT_AMBULATORY_CARE_PROVIDER_SITE_OTHER): Admitting: Family Medicine

## 2023-12-24 VITALS — BP 123/72 | HR 80 | Wt 304.1 lb

## 2023-12-24 DIAGNOSIS — O0991 Supervision of high risk pregnancy, unspecified, first trimester: Secondary | ICD-10-CM | POA: Diagnosis not present

## 2023-12-24 DIAGNOSIS — O0990 Supervision of high risk pregnancy, unspecified, unspecified trimester: Secondary | ICD-10-CM

## 2023-12-24 DIAGNOSIS — N96 Recurrent pregnancy loss: Secondary | ICD-10-CM | POA: Diagnosis not present

## 2023-12-24 DIAGNOSIS — Z3A13 13 weeks gestation of pregnancy: Secondary | ICD-10-CM | POA: Diagnosis not present

## 2023-12-24 DIAGNOSIS — O09899 Supervision of other high risk pregnancies, unspecified trimester: Secondary | ICD-10-CM | POA: Diagnosis not present

## 2023-12-24 DIAGNOSIS — Z2839 Other underimmunization status: Secondary | ICD-10-CM

## 2023-12-25 ENCOUNTER — Ambulatory Visit: Payer: Self-pay | Admitting: Family Medicine

## 2023-12-25 DIAGNOSIS — O24119 Pre-existing diabetes mellitus, type 2, in pregnancy, unspecified trimester: Secondary | ICD-10-CM | POA: Insufficient documentation

## 2023-12-25 LAB — COMPREHENSIVE METABOLIC PANEL WITH GFR
ALT: 15 IU/L (ref 0–32)
AST: 19 IU/L (ref 0–40)
Albumin: 3.8 g/dL — ABNORMAL LOW (ref 4.0–5.0)
Alkaline Phosphatase: 61 IU/L (ref 44–121)
BUN/Creatinine Ratio: 13 (ref 9–23)
BUN: 8 mg/dL (ref 6–20)
Bilirubin Total: 0.4 mg/dL (ref 0.0–1.2)
CO2: 19 mmol/L — ABNORMAL LOW (ref 20–29)
Calcium: 9 mg/dL (ref 8.7–10.2)
Chloride: 103 mmol/L (ref 96–106)
Creatinine, Ser: 0.6 mg/dL (ref 0.57–1.00)
Globulin, Total: 2.2 g/dL (ref 1.5–4.5)
Glucose: 167 mg/dL — ABNORMAL HIGH (ref 70–99)
Potassium: 4.3 mmol/L (ref 3.5–5.2)
Sodium: 136 mmol/L (ref 134–144)
Total Protein: 6 g/dL (ref 6.0–8.5)
eGFR: 125 mL/min/1.73 (ref >59)

## 2023-12-25 LAB — HEMOGLOBIN A1C
Est. average glucose Bld gHb Est-mCnc: 148 mg/dL
Hgb A1c MFr Bld: 6.8 % — ABNORMAL HIGH (ref 4.8–5.6)

## 2023-12-25 LAB — TSH RFX ON ABNORMAL TO FREE T4: TSH: 0.771 u[IU]/mL (ref 0.450–4.500)

## 2023-12-25 MED ORDER — LANCET DEVICE MISC: 1.0000 | Freq: Three times a day (TID) | 0 refills | Status: AC

## 2023-12-25 MED ORDER — BLOOD GLUCOSE TEST VI STRP: 1.0000 | ORAL_STRIP | Freq: Four times a day (QID) | 6 refills | Status: AC

## 2023-12-25 MED ORDER — LANCETS MISC. MISC: 1.0000 | Freq: Four times a day (QID) | 6 refills | Status: AC

## 2023-12-25 MED ORDER — BLOOD GLUCOSE MONITORING SUPPL DEVI: 1.0000 | Freq: Four times a day (QID) | 0 refills | Status: AC
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
## 2023-12-29 ENCOUNTER — Other Ambulatory Visit: Payer: Self-pay

## 2023-12-29 DIAGNOSIS — O0990 Supervision of high risk pregnancy, unspecified, unspecified trimester: Secondary | ICD-10-CM

## 2023-12-29 LAB — PANORAMA PRENATAL TEST FULL PANEL:PANORAMA TEST PLUS 5 ADDITIONAL MICRODELETIONS: FETAL FRACTION: 2.3

## 2023-12-30 DIAGNOSIS — Z419 Encounter for procedure for purposes other than remedying health state, unspecified: Secondary | ICD-10-CM | POA: Diagnosis not present

## 2023-12-31 ENCOUNTER — Telehealth: Payer: Self-pay

## 2024-01-01 ENCOUNTER — Other Ambulatory Visit (INDEPENDENT_AMBULATORY_CARE_PROVIDER_SITE_OTHER)

## 2024-01-01 ENCOUNTER — Telehealth: Payer: Self-pay

## 2024-01-01 DIAGNOSIS — O0990 Supervision of high risk pregnancy, unspecified, unspecified trimester: Secondary | ICD-10-CM

## 2024-01-01 NOTE — Telephone Encounter (Signed)
 Patient called after hours line and left voicemail with triage. She called this morning wanting to speak with a nurse for a second opinion. Informed her that our nurse is in an intake but she will contact her once she is done with direct patient care.

## 2024-01-09 ENCOUNTER — Encounter: Payer: Self-pay | Admitting: Obstetrics and Gynecology

## 2024-01-13 ENCOUNTER — Inpatient Hospital Stay (HOSPITAL_COMMUNITY)
Admission: AD | Admit: 2024-01-13 | Discharge: 2024-01-14 | Disposition: A | Discharge status: Home / Self Care | Attending: Obstetrics and Gynecology | Admitting: Obstetrics and Gynecology

## 2024-01-13 DIAGNOSIS — R519 Other specified pregnancy related conditions, second trimester: Secondary | ICD-10-CM | POA: Insufficient documentation

## 2024-01-13 DIAGNOSIS — O99282 Endocrine, nutritional and metabolic diseases complicating pregnancy, second trimester: Secondary | ICD-10-CM | POA: Insufficient documentation

## 2024-01-13 DIAGNOSIS — O26892 Other specified pregnancy related conditions, second trimester: Secondary | ICD-10-CM | POA: Diagnosis not present

## 2024-01-13 DIAGNOSIS — M25552 Pain in left hip: Secondary | ICD-10-CM | POA: Diagnosis not present

## 2024-01-13 DIAGNOSIS — Z3A16 16 weeks gestation of pregnancy: Secondary | ICD-10-CM | POA: Insufficient documentation

## 2024-01-13 DIAGNOSIS — N949 Unspecified condition associated with female genital organs and menstrual cycle: Secondary | ICD-10-CM | POA: Diagnosis not present

## 2024-01-13 DIAGNOSIS — E860 Dehydration: Secondary | ICD-10-CM | POA: Diagnosis not present

## 2024-01-13 DIAGNOSIS — R1032 Left lower quadrant pain: Secondary | ICD-10-CM | POA: Diagnosis present

## 2024-01-13 DIAGNOSIS — R102 Pelvic and perineal pain: Secondary | ICD-10-CM | POA: Diagnosis present

## 2024-01-13 LAB — URINALYSIS, ROUTINE W REFLEX MICROSCOPIC
Bilirubin Urine: NEGATIVE
Glucose, UA: 150 mg/dL — AB
Hgb urine dipstick: NEGATIVE
Ketones, ur: 20 mg/dL — AB
Leukocytes,Ua: NEGATIVE
Nitrite: NEGATIVE
Protein, ur: 30 mg/dL — AB
Specific Gravity, Urine: 1.031 — ABNORMAL HIGH (ref 1.005–1.030)
pH: 5 (ref 5.0–8.0)

## 2024-01-13 MED ORDER — CYCLOBENZAPRINE HCL 5 MG PO TABS
10.0000 mg | ORAL_TABLET | Freq: Once | ORAL | Status: AC
  Administered 2024-01-14: 10 mg via ORAL
  Filled 2024-01-13: qty 2

## 2024-01-13 MED ORDER — LACTATED RINGERS IV BOLUS: 1000.0000 mL | Freq: Once | INTRAVENOUS | Status: DC

## 2024-01-13 MED ORDER — ACETAMINOPHEN 500 MG PO TABS
1000.0000 mg | ORAL_TABLET | Freq: Once | ORAL | Status: AC
  Administered 2024-01-14: 1000 mg via ORAL
  Filled 2024-01-13: qty 2

## 2024-01-13 NOTE — MAU Note (Signed)
 Gina Lucas is a 28 y.o. at [redacted]w[redacted]d here in MAU reporting pressure in LLQ or groin for 2 wks. L labia majora with pain. No pumps or anything. Groin pain is worse with movement and only helps if she lays down and brings knees up or kneels beside her bed and rests trunk on bed. No VB or d/c.   LMP: na Onset of complaint: 2wks Pain score: 8 Vitals:   01/13/24 2126  BP: 129/81  Pulse: 84  Resp: 17  Temp: 98.5 F (36.9 C)  SpO2: 100%     FHT: 155  Lab orders placed from triage: u/a
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
## 2024-01-14 DIAGNOSIS — N949 Unspecified condition associated with female genital organs and menstrual cycle: Secondary | ICD-10-CM

## 2024-01-14 DIAGNOSIS — E860 Dehydration: Secondary | ICD-10-CM

## 2024-01-14 DIAGNOSIS — Z3A16 16 weeks gestation of pregnancy: Secondary | ICD-10-CM

## 2024-01-14 DIAGNOSIS — R519 Headache, unspecified: Secondary | ICD-10-CM | POA: Diagnosis not present

## 2024-01-14 DIAGNOSIS — O26892 Other specified pregnancy related conditions, second trimester: Secondary | ICD-10-CM

## 2024-01-14 LAB — CBC WITH DIFFERENTIAL/PLATELET
Abs Immature Granulocytes: 0.09 K/uL — ABNORMAL HIGH (ref 0.00–0.07)
Basophils Absolute: 0 K/uL (ref 0.0–0.1)
Basophils Relative: 0 %
Eosinophils Absolute: 0.2 K/uL (ref 0.0–0.5)
Eosinophils Relative: 2 %
HCT: 38.4 % (ref 36.0–46.0)
Hemoglobin: 13 g/dL (ref 12.0–15.0)
Immature Granulocytes: 1 %
Lymphocytes Relative: 31 %
Lymphs Abs: 3.2 K/uL (ref 0.7–4.0)
MCH: 29.4 pg (ref 26.0–34.0)
MCHC: 33.9 g/dL (ref 30.0–36.0)
MCV: 86.9 fL (ref 80.0–100.0)
Monocytes Absolute: 0.8 K/uL (ref 0.1–1.0)
Monocytes Relative: 7 %
Neutro Abs: 6.2 K/uL (ref 1.7–7.7)
Neutrophils Relative %: 59 %
Platelets: 194 K/uL (ref 150–400)
RBC: 4.42 MIL/uL (ref 3.87–5.11)
RDW: 12.5 % (ref 11.5–15.5)
WBC: 10.5 K/uL (ref 4.0–10.5)
nRBC: 0 % (ref 0.0–0.2)

## 2024-01-14 MED ORDER — CYCLOBENZAPRINE HCL 5 MG PO TABS: 5.0000 mg | ORAL_TABLET | Freq: Three times a day (TID) | ORAL | 0 refills | Status: DC | PRN

## 2024-01-14 NOTE — Discharge Instructions (Signed)

## 2024-01-14 NOTE — Progress Notes (Signed)
 Written and verbal d/c instructions given and understanding voiced.

## 2024-01-22 ENCOUNTER — Ambulatory Visit (INDEPENDENT_AMBULATORY_CARE_PROVIDER_SITE_OTHER): Admitting: Obstetrics and Gynecology

## 2024-01-22 VITALS — BP 122/75 | HR 91 | Wt 300.0 lb

## 2024-01-22 DIAGNOSIS — O24119 Pre-existing diabetes mellitus, type 2, in pregnancy, unspecified trimester: Secondary | ICD-10-CM

## 2024-01-22 DIAGNOSIS — O99210 Obesity complicating pregnancy, unspecified trimester: Secondary | ICD-10-CM | POA: Insufficient documentation

## 2024-01-22 DIAGNOSIS — Z3A17 17 weeks gestation of pregnancy: Secondary | ICD-10-CM | POA: Diagnosis not present

## 2024-01-22 DIAGNOSIS — O0990 Supervision of high risk pregnancy, unspecified, unspecified trimester: Secondary | ICD-10-CM

## 2024-01-22 NOTE — Addendum Note (Signed)
 Addended by: TANDA CREE L on: 01/22/2024 10:21 AM   Modules accepted: Orders
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
## 2024-01-24 LAB — AFP, SERUM, OPEN SPINA BIFIDA
AFP MoM: 1.49
AFP Value: 38 ng/mL
Gest. Age on Collection Date: 17 wk
Maternal Age At EDD: 29.4 a
OSBR Risk 1 IN: 2809
Test Results:: NEGATIVE
Weight: 300 [lb_av]

## 2024-01-24 LAB — CULTURE, OB URINE

## 2024-01-24 LAB — URINE CULTURE, OB REFLEX

## 2024-01-26 ENCOUNTER — Ambulatory Visit: Payer: Self-pay | Admitting: Obstetrics and Gynecology

## 2024-01-26 DIAGNOSIS — O0990 Supervision of high risk pregnancy, unspecified, unspecified trimester: Secondary | ICD-10-CM

## 2024-01-30 DIAGNOSIS — Z419 Encounter for procedure for purposes other than remedying health state, unspecified: Secondary | ICD-10-CM | POA: Diagnosis not present

## 2024-02-05 ENCOUNTER — Ambulatory Visit: Admitting: Obstetrics & Gynecology

## 2024-02-05 VITALS — BP 117/71 | HR 90 | Wt 303.0 lb

## 2024-02-05 DIAGNOSIS — O99210 Obesity complicating pregnancy, unspecified trimester: Secondary | ICD-10-CM | POA: Diagnosis not present

## 2024-02-05 DIAGNOSIS — O0990 Supervision of high risk pregnancy, unspecified, unspecified trimester: Secondary | ICD-10-CM

## 2024-02-05 DIAGNOSIS — O24119 Pre-existing diabetes mellitus, type 2, in pregnancy, unspecified trimester: Secondary | ICD-10-CM

## 2024-02-05 DIAGNOSIS — Z3A19 19 weeks gestation of pregnancy: Secondary | ICD-10-CM | POA: Diagnosis not present

## 2024-02-05 MED ORDER — LEVEMIR FLEXTOUCH 100 UNIT/ML ~~LOC~~ SOPN
10.0000 [IU] | PEN_INJECTOR | Freq: Two times a day (BID) | SUBCUTANEOUS | 11 refills | Status: DC
Start: 2024-02-05 — End: 2024-02-06
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
## 2024-02-06 ENCOUNTER — Other Ambulatory Visit: Payer: Self-pay

## 2024-02-06 DIAGNOSIS — O24119 Pre-existing diabetes mellitus, type 2, in pregnancy, unspecified trimester: Secondary | ICD-10-CM

## 2024-02-06 MED ORDER — LEVEMIR FLEXTOUCH 100 UNIT/ML ~~LOC~~ SOPN
10.0000 [IU] | PEN_INJECTOR | Freq: Two times a day (BID) | SUBCUTANEOUS | 11 refills | Status: DC
Start: 2024-02-06 — End: 2024-02-11

## 2024-02-09 ENCOUNTER — Other Ambulatory Visit: Payer: Self-pay

## 2024-02-09 ENCOUNTER — Telehealth: Payer: Self-pay

## 2024-02-09 NOTE — Telephone Encounter (Signed)
 Patient called in and said that she was informed by [facility] that they have discontinued her insulin . Patient would like updated prescription sent to the [facility] on file.

## 2024-02-11 ENCOUNTER — Other Ambulatory Visit: Payer: Self-pay | Admitting: Family Medicine

## 2024-02-11 ENCOUNTER — Ambulatory Visit (HOSPITAL_BASED_OUTPATIENT_CLINIC_OR_DEPARTMENT_OTHER)

## 2024-02-11 ENCOUNTER — Other Ambulatory Visit: Payer: Self-pay | Admitting: *Deleted

## 2024-02-11 ENCOUNTER — Ambulatory Visit: Attending: Obstetrics | Admitting: Obstetrics

## 2024-02-11 VITALS — BP 126/65 | HR 93

## 2024-02-11 DIAGNOSIS — O24112 Pre-existing diabetes mellitus, type 2, in pregnancy, second trimester: Secondary | ICD-10-CM | POA: Insufficient documentation

## 2024-02-11 DIAGNOSIS — Z794 Long term (current) use of insulin: Secondary | ICD-10-CM

## 2024-02-11 DIAGNOSIS — O99212 Obesity complicating pregnancy, second trimester: Secondary | ICD-10-CM | POA: Insufficient documentation

## 2024-02-11 DIAGNOSIS — O2622 Pregnancy care for patient with recurrent pregnancy loss, second trimester: Secondary | ICD-10-CM | POA: Diagnosis not present

## 2024-02-11 DIAGNOSIS — E119 Type 2 diabetes mellitus without complications: Secondary | ICD-10-CM

## 2024-02-11 DIAGNOSIS — O99210 Obesity complicating pregnancy, unspecified trimester: Secondary | ICD-10-CM

## 2024-02-11 DIAGNOSIS — O9A212 Injury, poisoning and certain other consequences of external causes complicating pregnancy, second trimester: Secondary | ICD-10-CM | POA: Diagnosis not present

## 2024-02-11 DIAGNOSIS — Z363 Encounter for antenatal screening for malformations: Secondary | ICD-10-CM | POA: Insufficient documentation

## 2024-02-11 DIAGNOSIS — O09292 Supervision of pregnancy with other poor reproductive or obstetric history, second trimester: Secondary | ICD-10-CM

## 2024-02-11 DIAGNOSIS — O0990 Supervision of high risk pregnancy, unspecified, unspecified trimester: Secondary | ICD-10-CM

## 2024-02-11 DIAGNOSIS — E1165 Type 2 diabetes mellitus with hyperglycemia: Secondary | ICD-10-CM

## 2024-02-11 DIAGNOSIS — Z3A20 20 weeks gestation of pregnancy: Secondary | ICD-10-CM | POA: Insufficient documentation

## 2024-02-11 DIAGNOSIS — O24119 Pre-existing diabetes mellitus, type 2, in pregnancy, unspecified trimester: Secondary | ICD-10-CM

## 2024-02-11 DIAGNOSIS — M25552 Pain in left hip: Secondary | ICD-10-CM | POA: Diagnosis not present

## 2024-02-11 DIAGNOSIS — E669 Obesity, unspecified: Secondary | ICD-10-CM | POA: Diagnosis not present

## 2024-02-11 DIAGNOSIS — N96 Recurrent pregnancy loss: Secondary | ICD-10-CM

## 2024-02-11 DIAGNOSIS — O2620 Pregnancy care for patient with recurrent pregnancy loss, unspecified trimester: Secondary | ICD-10-CM | POA: Insufficient documentation

## 2024-02-11 DIAGNOSIS — O26892 Other specified pregnancy related conditions, second trimester: Secondary | ICD-10-CM | POA: Diagnosis not present

## 2024-02-11 MED ORDER — BD PEN NEEDLE NANO 2ND GEN 32G X 4 MM MISC: 1.0000 [IU] | Freq: Every day | 12 refills | Status: DC

## 2024-02-11 MED ORDER — INSULIN GLARGINE 100 UNIT/ML SOLOSTAR PEN: 20.0000 [IU] | PEN_INJECTOR | Freq: Every day | SUBCUTANEOUS | 12 refills | Status: DC

## 2024-02-11 MED ORDER — INSULIN ASPART FLEXPEN 100 UNIT/ML ~~LOC~~ SOPN: 5.0000 [IU] | PEN_INJECTOR | Freq: Three times a day (TID) | SUBCUTANEOUS | 12 refills | Status: DC

## 2024-02-11 NOTE — Progress Notes (Signed)
 Dr Ileana came to speak to me due to patient having grossly elevated BG levels. Reviewed chart and patient is known T2DM. She has not seen DM Education.   Fastings= 130-160 PP in the 200s to 300s  She is not taking insulin  due to a pharmacy related issue per report  Called [id number], in [place]-- talked to Pharmacist We reviewed that Levemir  type was discontinued Lantus  pens are available and sent in these with needles as well as aspart  [facility] needs insurance card and Shanda at front desk informed patient she needed to bring card to pharmacy Pharmacist mentioned possibly needing PA  Meds ordered this encounter  Medications   insulin  glargine (LANTUS ) 100 UNIT/ML Solostar Pen    Sig: Inject 20 Units into the skin daily.    Dispense:  6 mL    Refill:  12    Type 2 DM, poorly controlled   Insulin  Pen Needle (BD PEN NEEDLE NANO 2ND GEN) 32G X 4 MM MISC    Sig: 1 Units by Does not apply route daily.    Dispense:  60 each    Refill:  12    Patient with new onset Type 2 Diabetes, poorly controlled.   Insulin  Aspart FlexPen (NOVOLOG ) 100 UNIT/ML    Sig: Inject 5 Units into the skin with breakfast, with lunch, and with evening meal.    Dispense:  3 mL    Refill:  12    Future Appointments  Date Time Provider Department Center  03/03/2024  8:15 AM Stinson, Jacob J, DO [facility] None  03/10/2024  1:15 PM [facility] PROVIDER 1 [facility]  03/10/2024  1:30 PM [facility] US4 [facility]  04/01/2024  3:30 PM Stinson, Jacob J, DO [facility] None

## 2024-02-11 NOTE — Progress Notes (Signed)
 MFM Consult Note  Gina Lucas is currently at 20 weeks and 1 day.  She was seen due to maternal obesity with a BMI of 47.6 and pregestational diabetes.    She was diagnosed with pregestational diabetes earlier in her current pregnancy.  Her hemoglobin A1c drawn 1 month ago was 6.8%.    The patient reports that she was prescribed insulin .  However, the pharmacy told her that Levemir  insulin  that she was prescribed is no longer available.  The patient reports that as she has not been treated with any medications, her fasting fingerstick values are in the 180s to 190s range and her 2-hour postprandials are in the 220s to 230s range.  She had a cell free DNA test earlier in her pregnancy which indicated a low risk for trisomy 21, 22, and 13. A female fetus is predicted.   Sonographic findings Single intrauterine pregnancy at 20w 1d  Fetal cardiac activity:  Observed and appears normal. Presentation: Breech. The anatomic structures that were well seen appear normal without evidence of soft markers. Due to poor acoustic windows some structures remain suboptimally visualized. Fetal biometry shows the estimated fetal weight at the 28 thpercentile.  Amniotic fluid: Within normal limits.  MVP: 4.9 cm. Placenta: Posterior. Adnexa: No abnormality visualized. Cervical length: 3.5 cm.  The views of the fetal anatomy were limited today due to the maternal body habitus.  What was visualized today appeared within normal limits.  The patient was informed that anomalies may be missed due to technical limitations. If the fetus is in a suboptimal position or maternal habitus is increased, visualization of the fetus in the maternal uterus may be impaired.  Pregestational diabetes The implications and management of diabetes in pregnancy was discussed in detail with the patient.   She was advised to continue to monitor her fingersticks 4 times daily (fasting and 2 hours after each meal).   She was advised that  our goals for her fingerstick values are fasting values of 90-95 or less and two-hour postprandial values of 120 or less.   As her fingerstick values are extremely elevated, she should be started on a regimen of short acting and long-acting insulin  as soon as possible. I discussed her case with Dr. Eldonna.  She will send in a prescription for short acting and long-acting insulin  for the patient today. Due to pregestational diabetes, she already has a fetal echocardiogram scheduled with [facility] pediatric cardiology next month. We will continue to follow her with monthly growth ultrasounds.   Weekly fetal testing should be started at 32 weeks. The patient understands that should her glycemic control remain poor, delivery will be recommended at around 37 weeks.  A follow-up exam was scheduled in 4 weeks to assess the fetal growth and to complete the views of the fetal anatomy.    The patient stated that all of her questions were answered today.  A total of 45 minutes was spent counseling and coordinating the care for this patient.  Greater than 50% of the time was spent in direct face-to-face contact.

## 2024-02-17 ENCOUNTER — Other Ambulatory Visit

## 2024-02-19 ENCOUNTER — Encounter: Admitting: Family Medicine

## 2024-03-02 ENCOUNTER — Encounter

## 2024-03-03 ENCOUNTER — Ambulatory Visit (INDEPENDENT_AMBULATORY_CARE_PROVIDER_SITE_OTHER): Admitting: Family Medicine

## 2024-03-03 VITALS — BP 128/66 | HR 91 | Wt 304.0 lb

## 2024-03-03 DIAGNOSIS — Z3A23 23 weeks gestation of pregnancy: Secondary | ICD-10-CM | POA: Diagnosis not present

## 2024-03-03 DIAGNOSIS — Z794 Long term (current) use of insulin: Secondary | ICD-10-CM | POA: Diagnosis not present

## 2024-03-03 DIAGNOSIS — E1165 Type 2 diabetes mellitus with hyperglycemia: Secondary | ICD-10-CM

## 2024-03-03 DIAGNOSIS — O0990 Supervision of high risk pregnancy, unspecified, unspecified trimester: Secondary | ICD-10-CM

## 2024-03-03 DIAGNOSIS — Z6841 Body Mass Index (BMI) 40.0 and over, adult: Secondary | ICD-10-CM

## 2024-03-03 DIAGNOSIS — Z2839 Other underimmunization status: Secondary | ICD-10-CM | POA: Diagnosis not present

## 2024-03-03 DIAGNOSIS — Z3490 Encounter for supervision of normal pregnancy, unspecified, unspecified trimester: Secondary | ICD-10-CM

## 2024-03-03 DIAGNOSIS — O24119 Pre-existing diabetes mellitus, type 2, in pregnancy, unspecified trimester: Secondary | ICD-10-CM

## 2024-03-03 LAB — POCT URINALYSIS DIPSTICK OB
Bilirubin, UA: NEGATIVE
Blood, UA: NEGATIVE
Glucose, UA: NEGATIVE
Ketones, UA: NEGATIVE
Nitrite, UA: NEGATIVE
Spec Grav, UA: 1.015 (ref 1.010–1.025)
Urobilinogen, UA: 0.2 U/dL
pH, UA: 6 (ref 5.0–8.0)

## 2024-03-03 MED ORDER — INSULIN ASPART FLEXPEN 100 UNIT/ML ~~LOC~~ SOPN: 5.0000 [IU] | PEN_INJECTOR | Freq: Three times a day (TID) | SUBCUTANEOUS | 12 refills | Status: DC

## 2024-03-03 MED ORDER — INSULIN ASPART FLEXPEN 100 UNIT/ML ~~LOC~~ SOPN: 15.0000 [IU] | PEN_INJECTOR | Freq: Three times a day (TID) | SUBCUTANEOUS | 12 refills | Status: DC

## 2024-03-03 MED ORDER — INSULIN GLARGINE 100 UNIT/ML SOLOSTAR PEN: 28.0000 [IU] | PEN_INJECTOR | Freq: Two times a day (BID) | SUBCUTANEOUS | 12 refills | Status: DC

## 2024-03-03 NOTE — Progress Notes (Signed)
   PRENATAL VISIT NOTE  Subjective:  Gina Lucas is a 28 y.o. [phone] at [redacted]w[redacted]d being seen today for ongoing prenatal care.  She is currently monitored for the following issues for this high-risk pregnancy and has History of recurrent miscarriages; Supervision of high risk pregnancy, antepartum; Rubella non-immune status, antepartum; Type 2 diabetes mellitus affecting pregnancy, antepartum; and Obesity in pregnancy on their problem list.  Patient reports no complaints.  Contractions: Not present. Vag. Bleeding: None.  Movement: Present. Denies leaking of fluid.   The following portions of the patient's history were reviewed and updated as appropriate: allergies, current medications, past family history, past medical history, past social history, past surgical history and problem list.   Objective:    Vitals:   03/03/24 0816  BP: 128/66  Pulse: 91  Weight: (!) 304 lb (137.9 kg)    Fetal Status:  Fetal Heart Rate (bpm): 144   Movement: Present    General: Alert, oriented and cooperative. Patient is in no acute distress.  Skin: Skin is warm and dry. No rash noted.   Cardiovascular: Normal heart rate noted  Respiratory: Normal respiratory effort, no problems with respiration noted  Abdomen: Soft, gravid, appropriate for gestational age.  Pain/Pressure: Absent     Pelvic: Cervical exam deferred        Extremities: Normal range of motion.  Edema: Trace  Mental Status: Normal mood and affect. Normal behavior. Normal judgment and thought content.   Assessment and Plan:  Pregnancy: [id number] at [redacted]w[redacted]d 1. [redacted] weeks gestation of pregnancy (Primary)   2. Supervision of high risk pregnancy, antepartum FHT normal  3. Type 2 diabetes mellitus affecting pregnancy, antepartum CBGs 180s.  Increase lantus  to 28 units BID Increase premeal to 15 units TID. Has fetal echo scheduled and ophthalmology referral for January. - Insulin  Aspart FlexPen (NOVOLOG ) 100 UNIT/ML; Inject 5 Units into the  skin with breakfast, with lunch, and with evening meal.  Dispense: 15 mL; Refill: 12 - insulin  glargine (LANTUS ) 100 UNIT/ML Solostar Pen; Inject 28 Units into the skin 2 (two) times daily.  Dispense: 16.8 mL; Refill: 12  4. Hyperglycemia due to diabetes mellitus (HCC) - Insulin  Aspart FlexPen (NOVOLOG ) 100 UNIT/ML; Inject 5 Units into the skin with breakfast, with lunch, and with evening meal.  Dispense: 15 mL; Refill: 12 - insulin  glargine (LANTUS ) 100 UNIT/ML Solostar Pen; Inject 28 Units into the skin 2 (two) times daily.  Dispense: 16.8 mL; Refill: 12  5. BMI 45.0-49.9, adult (HCC)  6. Rubella non-immune status, antepartum   Preterm labor symptoms and general obstetric precautions including but not limited to vaginal bleeding, contractions, leaking of fluid and fetal movement were reviewed in detail with the patient. Please refer to After Visit Summary for other counseling recommendations.   No follow-ups on file.  Future Appointments  Date Time Provider Department Center  03/10/2024  1:15 PM [facility] PROVIDER 1 [facility]  03/10/2024  1:30 PM [facility] US4 [facility]  04/01/2024  3:30 PM Barbra Lang PARAS, DO [facility] None  04/12/2024  8:55 AM Abigail, Rollo DASEN, MD [facility] None  04/27/2024  8:15 AM Larwence Mliss SAILOR, PA-C [facility] None  05/10/2024  8:15 AM Eveline Lynwood MATSU, MD [facility] None    Christoher Drudge J Hulen Mandler, DO

## 2024-03-04 DIAGNOSIS — O24112 Pre-existing diabetes mellitus, type 2, in pregnancy, second trimester: Secondary | ICD-10-CM | POA: Diagnosis not present

## 2024-03-10 ENCOUNTER — Ambulatory Visit

## 2024-03-10 ENCOUNTER — Other Ambulatory Visit: Payer: Self-pay | Admitting: *Deleted

## 2024-03-10 ENCOUNTER — Ambulatory Visit: Attending: Obstetrics | Admitting: Obstetrics

## 2024-03-10 VITALS — BP 118/69

## 2024-03-10 DIAGNOSIS — Z794 Long term (current) use of insulin: Secondary | ICD-10-CM | POA: Insufficient documentation

## 2024-03-10 DIAGNOSIS — O24319 Unspecified pre-existing diabetes mellitus in pregnancy, unspecified trimester: Secondary | ICD-10-CM | POA: Diagnosis not present

## 2024-03-10 DIAGNOSIS — O2622 Pregnancy care for patient with recurrent pregnancy loss, second trimester: Secondary | ICD-10-CM

## 2024-03-10 DIAGNOSIS — O24119 Pre-existing diabetes mellitus, type 2, in pregnancy, unspecified trimester: Secondary | ICD-10-CM | POA: Diagnosis present

## 2024-03-10 DIAGNOSIS — Z3A24 24 weeks gestation of pregnancy: Secondary | ICD-10-CM

## 2024-03-10 DIAGNOSIS — E119 Type 2 diabetes mellitus without complications: Secondary | ICD-10-CM

## 2024-03-10 DIAGNOSIS — O24414 Gestational diabetes mellitus in pregnancy, insulin controlled: Secondary | ICD-10-CM

## 2024-03-10 DIAGNOSIS — O99212 Obesity complicating pregnancy, second trimester: Secondary | ICD-10-CM | POA: Diagnosis not present

## 2024-03-10 DIAGNOSIS — O09292 Supervision of pregnancy with other poor reproductive or obstetric history, second trimester: Secondary | ICD-10-CM | POA: Insufficient documentation

## 2024-03-10 DIAGNOSIS — N96 Recurrent pregnancy loss: Secondary | ICD-10-CM | POA: Diagnosis present

## 2024-03-10 DIAGNOSIS — O24112 Pre-existing diabetes mellitus, type 2, in pregnancy, second trimester: Secondary | ICD-10-CM

## 2024-03-10 DIAGNOSIS — O99210 Obesity complicating pregnancy, unspecified trimester: Secondary | ICD-10-CM | POA: Diagnosis present

## 2024-03-10 DIAGNOSIS — Z362 Encounter for other antenatal screening follow-up: Secondary | ICD-10-CM | POA: Diagnosis not present

## 2024-03-10 NOTE — Progress Notes (Signed)
 MFM Consult Note  Gina Lucas is currently at 24 weeks and 1 day.  She has been followed due to pregestational diabetes treated with insulin  and maternal obesity with a BMI of 47.6.    She reports that her fasting fingerstick values continue to be elevated in the 140s to 160s range and her 2-hour postprandial fingerstick values range from 110-180s range.  She reports that the 2-hour postprandials are mostly in the 160s to 180s range.  She reports feeling fetal movements throughout the day.  She had a normal fetal echocardiogram performed with [facility] pediatric cardiology.  Sonographic findings Single intrauterine pregnancy at 24w 1d.  Fetal cardiac activity:  Observed and appears normal. Presentation: Breech. Interval fetal anatomy appears normal. Fetal biometry shows the estimated fetal weight of 1 pound 9 ounces which measures at the 57th percentile. Amniotic fluid volume: Within normal limits. MVP: 6.59 cm. Placenta: Posterior.  Pregestational diabetes As the patient reports that her fasting fingerstick values and 2-hour postprandial fingerstick values remain extremely elevated, she was advised to make the following increases to her insulin  dose:  Levemir  insulin  a.m.,  increase from 28 units to 30 units.  NovoLog  insulin , increase from 15 units prior to each meal to 18 units prior to each meal. Levemir  insulin  at bedtime, increase from 28 units to 36 units.  The patient was advised that hopefully, the 19 additional units of insulin  daily will help lower her fingerstick values to a more normal range.  Based on her weight of 137.9 kg, she will most likely require a total of 130-140 units of insulin  daily.  The increased insulin  dose recommended today will increase her total insulin  to 120 units/day.  She was advised to continue to monitor her fingerstick values on a daily basis.    Additional adjustments to her insulin  dose may be necessary should her fingerstick values remain elevated.   Should her fingerstick values remain elevated despite insulin  treatment, consideration should be given to adding metformin  to her daily insulin  regimen.  Due to pregestational diabetes, we will continue to follow her with growth ultrasounds throughout her pregnancy.    Weekly fetal testing should be started at 32 weeks.    Should her glycemic control remain poor, delivery may be recommended at around 37 weeks.    She will return in 4 weeks for another growth scan.    The patient stated that all of her questions were answered today.  A total of 20 minutes was spent counseling and coordinating the care for this patient.  Greater than 50% of the time was spent in direct face-to-face contact.

## 2024-03-17 ENCOUNTER — Ambulatory Visit: Admitting: Family Medicine

## 2024-03-17 VITALS — BP 118/63 | HR 89 | Wt 307.0 lb

## 2024-03-17 DIAGNOSIS — Z794 Long term (current) use of insulin: Secondary | ICD-10-CM | POA: Diagnosis not present

## 2024-03-17 DIAGNOSIS — E1165 Type 2 diabetes mellitus with hyperglycemia: Secondary | ICD-10-CM | POA: Diagnosis not present

## 2024-03-17 DIAGNOSIS — O24119 Pre-existing diabetes mellitus, type 2, in pregnancy, unspecified trimester: Secondary | ICD-10-CM

## 2024-03-17 DIAGNOSIS — O99210 Obesity complicating pregnancy, unspecified trimester: Secondary | ICD-10-CM

## 2024-03-17 DIAGNOSIS — Z3490 Encounter for supervision of normal pregnancy, unspecified, unspecified trimester: Secondary | ICD-10-CM

## 2024-03-17 DIAGNOSIS — Z2839 Other underimmunization status: Secondary | ICD-10-CM

## 2024-03-17 DIAGNOSIS — Z3A25 25 weeks gestation of pregnancy: Secondary | ICD-10-CM

## 2024-03-17 DIAGNOSIS — O0990 Supervision of high risk pregnancy, unspecified, unspecified trimester: Secondary | ICD-10-CM

## 2024-03-17 MED ORDER — INSULIN GLARGINE 100 UNIT/ML SOLOSTAR PEN: 38.0000 [IU] | PEN_INJECTOR | Freq: Two times a day (BID) | SUBCUTANEOUS | 12 refills | Status: DC

## 2024-03-17 MED ORDER — INSULIN ASPART FLEXPEN 100 UNIT/ML ~~LOC~~ SOPN
22.0000 [IU] | PEN_INJECTOR | Freq: Three times a day (TID) | SUBCUTANEOUS | 12 refills | Status: DC
Start: 2024-03-17 — End: 2024-04-01

## 2024-03-17 NOTE — Progress Notes (Signed)
 PRENATAL VISIT NOTE  Subjective:  Gina Lucas is a 28 y.o. [phone] at [redacted]w[redacted]d being seen today for ongoing prenatal care.  She is currently monitored for the following issues for this high-risk pregnancy and has History of recurrent miscarriages; Supervision of high risk pregnancy, antepartum; Rubella non-immune status, antepartum; Type 2 diabetes mellitus affecting pregnancy, antepartum; and Obesity in pregnancy on their problem list.  Patient reports no complaints.  Contractions: Not present. Vag. Bleeding: None.  Movement: Present. Denies leaking of fluid.   The following portions of the patient's history were reviewed and updated as appropriate: allergies, current medications, past family history, past medical history, past social history, past surgical history and problem list.   Objective:    Vitals:   03/17/24 0850  BP: 118/63  Pulse: 89  Weight: (!) 307 lb (139.3 kg)    Fetal Status:  Fetal Heart Rate (bpm): 148   Movement: Present    General: Alert, oriented and cooperative. Patient is in no acute distress.  Skin: Skin is warm and dry. No rash noted.   Cardiovascular: Normal heart rate noted  Respiratory: Normal respiratory effort, no problems with respiration noted  Abdomen: Soft, gravid, appropriate for gestational age.  Pain/Pressure: Absent     Pelvic: Cervical exam deferred        Extremities: Normal range of motion.  Edema: Trace  Mental Status: Normal mood and affect. Normal behavior. Normal judgment and thought content.   Assessment and Plan:  Pregnancy: [id number] at [redacted]w[redacted]d [redacted]weeks gestation of pregnancy  1. Supervision of high risk pregnancy, antepartum (Primary) FHT normal  2. Type 2 diabetes mellitus affecting pregnancy, antepartum Still not controlled. Fasting still in the 120-150 range. Increase lantus  to 38 units BID, 22 units premeal - Insulin  Aspart FlexPen (NOVOLOG ) 100 UNIT/ML; Inject 22 Units into the skin with breakfast, with lunch, and with  evening meal.  Dispense: 15 mL; Refill: 12 - insulin  glargine (LANTUS ) 100 UNIT/ML Solostar Pen; Inject 38 Units into the skin 2 (two) times daily.  Dispense: 22.8 mL; Refill: 12  3. Obesity in pregnancy   4. Rubella non-immune status, antepartum   5. Hyperglycemia due to diabetes mellitus (HCC) - Insulin  Aspart FlexPen (NOVOLOG ) 100 UNIT/ML; Inject 22 Units into the skin with breakfast, with lunch, and with evening meal.  Dispense: 15 mL; Refill: 12 - insulin  glargine (LANTUS ) 100 UNIT/ML Solostar Pen; Inject 38 Units into the skin 2 (two) times daily.  Dispense: 22.8 mL; Refill: 12  Preterm labor symptoms and general obstetric precautions including but not limited to vaginal bleeding, contractions, leaking of fluid and fetal movement were reviewed in detail with the patient. Please refer to After Visit Summary for other counseling recommendations.   No follow-ups on file.  Future Appointments  Date Time Provider Department Center  04/01/2024  3:30 PM Barbra Lang PARAS, DO [facility] None  04/12/2024  8:55 AM Abigail, Rollo DASEN, MD [facility] None  04/13/2024  1:15 PM [facility] PROVIDER 1 [facility]  04/13/2024  1:30 PM [facility] US2 [facility]  04/27/2024  8:15 AM Larwence Mliss SAILOR, PA-C [facility] None  05/06/2024  1:15 PM [facility] PROVIDER 1 [facility]  05/06/2024  1:30 PM [facility] US4 [facility]  05/10/2024  8:15 AM Eveline Lynwood MATSU, MD [facility] None  05/18/2024  1:15 PM [facility] PROVIDER 1 [facility]  05/18/2024  1:30 PM [facility] US2 [facility]  05/25/2024  1:15 PM [facility] PROVIDER 1 [facility]  05/25/2024  1:30 PM [facility] US1 [facility]  Cherlynn Popiel J Ryah Cribb, DO

## 2024-03-31 DIAGNOSIS — Z419 Encounter for procedure for purposes other than remedying health state, unspecified: Secondary | ICD-10-CM | POA: Diagnosis not present

## 2024-04-01 ENCOUNTER — Ambulatory Visit (INDEPENDENT_AMBULATORY_CARE_PROVIDER_SITE_OTHER): Admitting: Family Medicine

## 2024-04-01 VITALS — BP 107/80 | HR 87 | Wt 308.0 lb

## 2024-04-01 DIAGNOSIS — E1165 Type 2 diabetes mellitus with hyperglycemia: Secondary | ICD-10-CM | POA: Diagnosis not present

## 2024-04-01 DIAGNOSIS — Z3A27 27 weeks gestation of pregnancy: Secondary | ICD-10-CM | POA: Diagnosis not present

## 2024-04-01 DIAGNOSIS — O24119 Pre-existing diabetes mellitus, type 2, in pregnancy, unspecified trimester: Secondary | ICD-10-CM

## 2024-04-01 DIAGNOSIS — O99210 Obesity complicating pregnancy, unspecified trimester: Secondary | ICD-10-CM

## 2024-04-01 DIAGNOSIS — Z794 Long term (current) use of insulin: Secondary | ICD-10-CM | POA: Diagnosis not present

## 2024-04-01 DIAGNOSIS — O0990 Supervision of high risk pregnancy, unspecified, unspecified trimester: Secondary | ICD-10-CM | POA: Diagnosis not present

## 2024-04-01 MED ORDER — FREESTYLE LIBRE 3 PLUS SENSOR MISC: 3 refills | Status: AC

## 2024-04-01 MED ORDER — INSULIN GLARGINE 100 UNIT/ML SOLOSTAR PEN: 42.0000 [IU] | PEN_INJECTOR | Freq: Two times a day (BID) | SUBCUTANEOUS | 12 refills | Status: DC

## 2024-04-01 MED ORDER — INSULIN ASPART FLEXPEN 100 UNIT/ML ~~LOC~~ SOPN: 26.0000 [IU] | PEN_INJECTOR | Freq: Three times a day (TID) | SUBCUTANEOUS | 12 refills | Status: DC

## 2024-04-01 MED ORDER — BD PEN NEEDLE NANO 2ND GEN 32G X 4 MM MISC
1.0000 [IU] | Freq: Every day | 12 refills | Status: AC
Start: 2024-04-01 — End: ?

## 2024-04-01 NOTE — Patient Instructions (Signed)
 INSULIN  TITRATION GUIDE For improving fasting and after-meal blood sugars Your Blood Sugar Goals Fasting (first thing in the morning): 95 or lower 2 hours after meals: 120 or lower  1. Basal Insulin  (Lantus ) Current dose: 38 units twice a day New starting dose: 42 units twice a day How to adjust your basal insulin  Check your fasting sugar every morning. If your fasting sugar is above 95 for 3 days in a row ? Increase your Lantus  by 2 units per dose. Continue checking daily. Every time your fasting numbers are high for 3 days in a row, increase again by 2 units. Example Day 1-3 fasting sugars: 150, 142, 138 ? still above 95 Increase Lantus  from 42 ? 44 units twice a day Check again for 3 days. Keep adjusting this way until your fasting sugar is at goal.  2. Mealtime Insulin  (Novolog ) Current dose: 22 units before each meal New starting dose: 26 units before each meal How to adjust your mealtime insulin  Check your blood sugar 2 hours after eating. If your 2-hour readings are above 120 for 3 days in a row, ? Increase Novolog  by 2 units before each meal. Example After-meal numbers for 3 days: 156, 162, 148 ? still above 120 Increase from 26 ? 28 units before meals Keep adjusting every 3 days the same way.  3. When to STOP increasing insulin  Stop and contact your provider if: You have any reading under 70 You have 2 or more readings under 80 You get shaky, sweaty, lightheaded, or confused Your fasting sugars suddenly drop by 30-40 points (Those are signs your dose may be too strong.)  4. Helpful Tips Always eat something when you take Novolog . (Skipping meals while taking it can cause low sugars.) Keep glucose tablets or juice with you in case of lows. Try to check sugars at similar times each day. Keep a log, even if brief--patterns matter more than single numbers.  5. What to Expect It usually takes 1-2 weeks of gradual adjustments to reach your goals. Insulin  needs  often rise during pregnancy, so dose increases are normal.

## 2024-04-01 NOTE — Progress Notes (Signed)
 PRENATAL VISIT NOTE  Subjective:  Gina Lucas is a 28 y.o. [phone] at [redacted]w[redacted]d being seen today for ongoing prenatal care.  She is currently monitored for the following issues for this high-risk pregnancy and has History of recurrent miscarriages; Supervision of high risk pregnancy, antepartum; Rubella non-immune status, antepartum; Type 2 diabetes mellitus affecting pregnancy, antepartum; and Obesity in pregnancy on their problem list.  Patient reports no complaints.  Contractions: Not present. Vag. Bleeding: None.  Movement: Present. Denies leaking of fluid.   The following portions of the patient's history were reviewed and updated as appropriate: allergies, current medications, past family history, past medical history, past social history, past surgical history and problem list.   Objective:   Vitals:   04/01/24 1532  BP: 107/80  Pulse: 87  Weight: (!) 308 lb (139.7 kg)    Fetal Status:  Fetal Heart Rate (bpm): 148   Movement: Present    General: Alert, oriented and cooperative. Patient is in no acute distress.  Skin: Skin is warm and dry. No rash noted.   Cardiovascular: Normal heart rate noted  Respiratory: Normal respiratory effort, no problems with respiration noted  Abdomen: Soft, gravid, appropriate for gestational age.  Pain/Pressure: Absent     Pelvic: Cervical exam deferred        Extremities: Normal range of motion.  Edema: None  Mental Status: Normal mood and affect. Normal behavior. Normal judgment and thought content.      12/24/2023    8:51 AM 04/03/2022   10:10 AM 12/28/2020    9:13 AM  Depression screen PHQ 2/9  Decreased Interest 0 1 1  Down, Depressed, Hopeless 0 2 1  PHQ - 2 Score 0 3 2  Altered sleeping 2 2 2   Tired, decreased energy 2 1 1   Change in appetite 2 2 2   Feeling bad or failure about yourself  0 2 1  Trouble concentrating 0 2 3  Moving slowly or fidgety/restless 0 0 0  Suicidal thoughts 0 1 0  PHQ-9 Score 6  13  11       Data saved  with a previous flowsheet row definition        12/24/2023    8:51 AM 04/03/2022   10:10 AM 12/28/2020    9:14 AM 11/01/2020    8:47 AM  GAD 7 : Generalized Anxiety Score  Nervous, Anxious, on Edge 0 0 3 0  Control/stop worrying 0 0 2 0  Worry too much - different things 0 0 3 0  Trouble relaxing 0 0 2 0  Restless 0 0 0 0  Easily annoyed or irritable 3 2 3 1   Afraid - awful might happen 0 0 1 0  Total GAD 7 Score 3 2 14 1     Assessment and Plan:  Pregnancy: [id number] at [redacted]w[redacted]d 1. [redacted] weeks gestation of pregnancy (Primary)  2. Supervision of high risk pregnancy, antepartum  3. Type 2 diabetes mellitus affecting pregnancy, antepartum Fastings 130s-150s 2hr PP - 150-220 Increase lantus  to 44 units BID Increase postprandial to 26 If numbers are still elevated in 3 days, increase units by 2.  Instructions placed in followup. Freestyle Libre CGM prescribed.  If still elevated, may need to be inpatient for further titration. - Insulin  Pen Needle (BD PEN NEEDLE NANO 2ND GEN) 32G X 4 MM MISC; 1 Units by Does not apply route 5 (five) times daily.  Dispense: 100 each; Refill: 12 - Insulin  Aspart FlexPen (NOVOLOG ) 100 UNIT/ML; Inject 26 Units into  the skin with breakfast, with lunch, and with evening meal.  Dispense: 15 mL; Refill: 12 - insulin  glargine (LANTUS ) 100 UNIT/ML Solostar Pen; Inject 42 Units into the skin 2 (two) times daily.  Dispense: 25.2 mL; Refill: 12  4. Hyperglycemia due to diabetes mellitus (HCC)  - Insulin  Pen Needle (BD PEN NEEDLE NANO 2ND GEN) 32G X 4 MM MISC; 1 Units by Does not apply route 5 (five) times daily.  Dispense: 100 each; Refill: 12 - Insulin  Aspart FlexPen (NOVOLOG ) 100 UNIT/ML; Inject 26 Units into the skin with breakfast, with lunch, and with evening meal.  Dispense: 15 mL; Refill: 12 - insulin  glargine (LANTUS ) 100 UNIT/ML Solostar Pen; Inject 42 Units into the skin 2 (two) times daily.  Dispense: 25.2 mL; Refill: 12  5. Obesity in  pregnancy   Preterm labor symptoms and general obstetric precautions including but not limited to vaginal bleeding, contractions, leaking of fluid and fetal movement were reviewed in detail with the patient. Please refer to After Visit Summary for other counseling recommendations.   No follow-ups on file.  Future Appointments  Date Time Provider Department Center  04/12/2024  8:55 AM Abigail, Rollo DASEN, MD [facility] None  04/13/2024  1:15 PM [facility] PROVIDER 1 [facility]  04/13/2024  1:30 PM [facility] US2 [facility]  04/27/2024  8:15 AM Larwence Mliss SAILOR, PA-C [facility] None  05/06/2024  1:15 PM [facility] PROVIDER 1 [facility]  05/06/2024  1:30 PM [facility] US4 [facility]  05/10/2024  8:15 AM Eveline Lynwood MATSU, MD [facility] None  05/18/2024  1:15 PM [facility] PROVIDER 1 [facility]  05/18/2024  1:30 PM [facility] US2 [facility]  05/25/2024  1:15 PM [facility] PROVIDER 1 [facility]  05/25/2024  1:30 PM [facility] US1 [facility]    Lang JINNY Peel, DO

## 2024-04-07 ENCOUNTER — Other Ambulatory Visit: Payer: Self-pay

## 2024-04-07 DIAGNOSIS — O24119 Pre-existing diabetes mellitus, type 2, in pregnancy, unspecified trimester: Secondary | ICD-10-CM

## 2024-04-07 DIAGNOSIS — E1165 Type 2 diabetes mellitus with hyperglycemia: Secondary | ICD-10-CM

## 2024-04-12 ENCOUNTER — Inpatient Hospital Stay (HOSPITAL_COMMUNITY): Admission: AD | Admit: 2024-04-12 | Source: Home / Self Care | Admitting: Obstetrics and Gynecology

## 2024-04-12 ENCOUNTER — Ambulatory Visit (INDEPENDENT_AMBULATORY_CARE_PROVIDER_SITE_OTHER): Admitting: Obstetrics and Gynecology

## 2024-04-12 VITALS — BP 122/63 | HR 91 | Wt 308.0 lb

## 2024-04-12 DIAGNOSIS — O0990 Supervision of high risk pregnancy, unspecified, unspecified trimester: Secondary | ICD-10-CM

## 2024-04-12 DIAGNOSIS — Z3A28 28 weeks gestation of pregnancy: Secondary | ICD-10-CM | POA: Diagnosis not present

## 2024-04-12 DIAGNOSIS — O99210 Obesity complicating pregnancy, unspecified trimester: Secondary | ICD-10-CM

## 2024-04-12 DIAGNOSIS — E1165 Type 2 diabetes mellitus with hyperglycemia: Secondary | ICD-10-CM | POA: Diagnosis not present

## 2024-04-12 DIAGNOSIS — Z794 Long term (current) use of insulin: Secondary | ICD-10-CM | POA: Diagnosis not present

## 2024-04-12 DIAGNOSIS — O24119 Pre-existing diabetes mellitus, type 2, in pregnancy, unspecified trimester: Secondary | ICD-10-CM

## 2024-04-12 NOTE — Progress Notes (Signed)
 PRENATAL VISIT NOTE  Subjective:  Gina Lucas is a 28 y.o. [phone] at [redacted]w[redacted]d being seen today for ongoing prenatal care.  She is currently monitored for the following issues for this high-risk pregnancy and has History of recurrent miscarriages; Supervision of high risk pregnancy, antepartum; Rubella non-immune status, antepartum; Type 2 diabetes mellitus affecting pregnancy, antepartum; and Obesity in pregnancy on their problem list.  Patient reports that she has not taken her insulin  in over 1.5 weeks. Reports that her insurance company isn't covering her insulin  needles/capsules. States prior to stopping her insulin  her numbers were also uncontrolled and didn't improve with the new regimen prescribed by Dr. Barbra.  Reports fastings are typically 140-150, postprandials are 160-240s.  Contractions: Irritability. Vag. Bleeding: None.  Movement: Present. Denies leaking of fluid.   Reports fastings   The following portions of the patient's history were reviewed and updated as appropriate: allergies, current medications, past family history, past medical history, past social history, past surgical history and problem list.   Objective:   Vitals:   04/12/24 1021  BP: 122/63  Pulse: 91  Weight: (!) 308 lb 0.6 oz (139.7 kg)    Fetal Status:  Fetal Heart Rate (bpm): 145   Movement: Present    General: Alert, oriented and cooperative. Patient is in no acute distress.  Skin: Skin is warm and dry. No rash noted.   Cardiovascular: Normal heart rate noted  Respiratory: Normal respiratory effort, no problems with respiration noted  Abdomen: Soft, gravid, appropriate for gestational age.  Pain/Pressure: Present     Pelvic: Cervical exam deferred        Extremities: Normal range of motion.  Edema: None  Mental Status: Normal mood and affect. Normal behavior. Normal judgment and thought content.      12/24/2023    8:51 AM 04/03/2022   10:10 AM 12/28/2020    9:13 AM  Depression screen PHQ  2/9  Decreased Interest 0 1 1  Down, Depressed, Hopeless 0 2 1  PHQ - 2 Score 0 3 2  Altered sleeping 2 2 2   Tired, decreased energy 2 1 1   Change in appetite 2 2 2   Feeling bad or failure about yourself  0 2 1  Trouble concentrating 0 2 3  Moving slowly or fidgety/restless 0 0 0  Suicidal thoughts 0 1 0  PHQ-9 Score 6  13  11       Data saved with a previous flowsheet row definition        12/24/2023    8:51 AM 04/03/2022   10:10 AM 12/28/2020    9:14 AM 11/01/2020    8:47 AM  GAD 7 : Generalized Anxiety Score  Nervous, Anxious, on Edge 0 0 3 0  Control/stop worrying 0 0 2 0  Worry too much - different things 0 0 3 0  Trouble relaxing 0 0 2 0  Restless 0 0 0 0  Easily annoyed or irritable 3 2 3 1   Afraid - awful might happen 0 0 1 0  Total GAD 7 Score 3 2 14 1     Assessment and Plan:  Pregnancy: [id number] at [redacted]w[redacted]d 1. Supervision of high risk pregnancy, antepartum (Primary) Anticipatory guidance Normal fetal echo Next growth scheduled for tomorrow - third trimester labs at hospital (see below)  2. Type 2 diabetes mellitus affecting pregnancy, antepartum Uncontrolled. Even when she was on insulin , she has high insulin  requirement with hyperglycemia and now due to insurance issues, she is unable to use her  insulin . Last visit Dr. Barbra had noted that if she continues to struggle with hyperglycemia, she may require hospitalization and I agree. I recommend that she have inpatient management and she is amenable to this. Current regimen: lantus  42 units BID, novolog  26 units TID Report to hospital for inpatient management - recommend A1c on admission   3. Obesity in pregnancy Serial growth  4. [redacted] weeks gestation of pregnancy   Preterm labor symptoms and general obstetric precautions including but not limited to vaginal bleeding, contractions, leaking of fluid and fetal movement were reviewed in detail with the patient. Please refer to After Visit Summary for other  counseling recommendations.   No follow-ups on file.  Future Appointments  Date Time Provider Department Center  04/13/2024  1:15 PM [facility] PROVIDER 1 [facility]  04/13/2024  1:30 PM [facility] US2 [facility]  04/27/2024  8:15 AM Larwence Mliss SAILOR, PA-C [facility] None  05/06/2024  1:15 PM [facility] PROVIDER 1 [facility]  05/06/2024  1:30 PM [facility] US4 [facility]  05/10/2024  8:15 AM Eveline Lynwood MATSU, MD [facility] None  05/18/2024  1:15 PM [facility] PROVIDER 1 [facility]  05/18/2024  1:30 PM [facility] US2 [facility]  05/25/2024  1:15 PM [facility] PROVIDER 1 [facility]  05/25/2024  1:30 PM [facility] US1 [facility]    Rollo ONEIDA Bring, MD

## 2024-04-13 ENCOUNTER — Ambulatory Visit (HOSPITAL_BASED_OUTPATIENT_CLINIC_OR_DEPARTMENT_OTHER)

## 2024-04-13 ENCOUNTER — Ambulatory Visit: Attending: Obstetrics | Admitting: Maternal & Fetal Medicine

## 2024-04-13 DIAGNOSIS — Z3A29 29 weeks gestation of pregnancy: Secondary | ICD-10-CM

## 2024-04-13 DIAGNOSIS — O321XX0 Maternal care for breech presentation, not applicable or unspecified: Secondary | ICD-10-CM | POA: Insufficient documentation

## 2024-04-13 DIAGNOSIS — O09293 Supervision of pregnancy with other poor reproductive or obstetric history, third trimester: Secondary | ICD-10-CM | POA: Diagnosis not present

## 2024-04-13 DIAGNOSIS — E669 Obesity, unspecified: Secondary | ICD-10-CM | POA: Diagnosis not present

## 2024-04-13 DIAGNOSIS — O24414 Gestational diabetes mellitus in pregnancy, insulin controlled: Secondary | ICD-10-CM

## 2024-04-13 DIAGNOSIS — O99212 Obesity complicating pregnancy, second trimester: Secondary | ICD-10-CM | POA: Diagnosis not present

## 2024-04-13 DIAGNOSIS — O24119 Pre-existing diabetes mellitus, type 2, in pregnancy, unspecified trimester: Secondary | ICD-10-CM

## 2024-04-13 MED ORDER — METFORMIN HCL 500 MG PO TABS: 500.0000 mg | ORAL_TABLET | Freq: Every day | ORAL | 1 refills | Status: DC

## 2024-04-13 NOTE — Progress Notes (Signed)
 Patient information  Patient Name: Gina Lucas  Patient MRN:   [id number]  Referring practice: MFM Referring Provider: [facility] ([facility])  Problem List   Patient Active Problem List   Diagnosis Date Noted   Obesity in pregnancy 01/22/2024   Type 2 diabetes mellitus affecting pregnancy, antepartum 12/25/2023   Rubella non-immune status, antepartum 12/04/2023   Supervision of high risk pregnancy, antepartum 12/02/2023   History of recurrent miscarriages 07/12/2020   Maternal Fetal medicine Consult  Sereen A Dado is a 28 y.o. [id number] at [redacted]w[redacted]d here for ultrasound and consultation. Vernessa A Orris is doing well today with no acute concerns. Today we focused on the following:   Uncontrolled type 2 diabetes: The patient reports that her blood sugars are not controlled.  Fasting blood sugars between 140 and 170.  Her postprandials are in the 200s to 250 range.  She was offered admission to the hospital but she does not have childcare.  She is also having difficulty administering her insulin  because her needle tips are not covered per her insurance.  I will reach out to her OB provider to see if traditional needle and syringes will be covered by her insurance and consider switching to NPH and regular.  The patient reports that she has tried to improve her diet but has not been successful.  I recommend starting metformin  500 mg daily to help sensitize her insulin  receptors and decrease her blood sugars especially in the absence of her normal insulin  regimen.  I discussed the importance of proper glycemic control to limit the risk of stillbirth and other complications such as fetal macrosomia.  The patient had time to ask questions that were answered to her satisfaction.  She verbalized understanding and agrees to proceed with the plan below.  Recommendations - Continue weekly antenatal testing.  She should also have a weekly NST with her OB provider's office to increase her surveillance  to twice weekly. - Growth ultrasound in 4 weeks. - Recommend OB provider consider switching to NPH and regular if this will allow her to have insurance cover her insulin  supplies - If the patient does obtain childcare admission to the hospital can be considered as well. - Delivery timing should occur around 36 weeks to 37 weeks or sooner pending clinical course.  I spent 30 minutes reviewing the patients chart, including labs and images as well as counseling the patient about her medical conditions. Greater than 50% of the time was spent in direct face-to-face patient counseling.  Delora Smaller  MFM,    04/13/2024  2:27 PM   Review of Systems: A review of systems was performed and was negative except per HPI   Vitals and Physical Exam    04/13/2024    1:34 PM 04/12/2024   10:21 AM 04/01/2024    3:32 PM  Vitals with BMI  Weight  308 lbs 1 oz 308 lbs  Systolic 116 122 892  Diastolic 63 63 80  Pulse 93 91 87    Sitting comfortably on the sonogram table Nonlabored breathing Normal rate and rhythm Abdomen is nontender  Past pregnancies OB History  Gravida Para Term Preterm AB Living  12 1 1  9 1   SAB IAB Ectopic Multiple Live Births  9 0 0  1    # Outcome Date GA Lbr Len/2nd Weight Sex Type Anes PTL Lv  12 Current           11 Term 12/21/20 [redacted]w[redacted]d  7  lb 10.6 oz (3.476 kg) M Vag-Spont EPI N LIV  10 SAB 10/2019          9 SAB 08/2018          8 SAB 09/2016          7 SAB           6 SAB           5 Gravida           4 SAB           3 SAB           2 SAB           1 SAB              Future Appointments  Date Time Provider Department Center  04/27/2024  8:15 AM Larwence Mliss SAILOR, PA-C [facility] None  05/06/2024  1:15 PM [facility] PROVIDER 1 [facility]  05/06/2024  1:30 PM [facility] US4 [facility]  05/10/2024  8:15 AM Eveline Lynwood MATSU, MD [facility] None  05/18/2024  1:15 PM [facility] PROVIDER 1 [facility]  05/18/2024  1:30 PM [facility] US2 [facility]  05/25/2024   1:15 PM [facility] PROVIDER 1 [facility]  05/25/2024  1:30 PM [facility] US1 [facility]

## 2024-04-14 ENCOUNTER — Telehealth: Payer: Self-pay

## 2024-04-14 NOTE — Telephone Encounter (Signed)
 Called patient to follow up as patient refused recommendation to be admitted for uncontrolled diabetes.  She states she was unable to go d/t childcare issues.  I called this morning to check to see if she was able to get her insulin  needles.  She states she is able to get them from her mother as she no longer is using them.  Stressed the importance of restarting her insulin  as prescribed by Dr. Barbra and asked her to call the office today if there was an issue.  She verbalized understanding.  Instructed patient to keep a log of blood sugar levels and send them in on Monday to be evaluated by MD.  She verbalized understanding.  Erminio DELENA Rumps, RN

## 2024-04-19 ENCOUNTER — Telehealth: Payer: Self-pay

## 2024-04-19 NOTE — Telephone Encounter (Signed)
-----   Message from Rollo ONEIDA Bring sent at 04/19/2024  1:14 PM EST ----- Patient needs weekly NSTs starting this week Erminio- we also need to check in with her about whether she got her insulin  and how her sugar levels are ----- Message ----- From: William Glenn, DO Sent: 04/18/2024   9:34 PM EST To: Rollo ONEIDA Bring, MD  Ok sounds good, hopefully that works! I think next week would be good, especially since we dont have good control.  Thanks! BNS ----- Message ----- From: Bring Rollo ONEIDA, MD Sent: 04/14/2024  10:04 AM EST To: Glenn William, DO  Yes, that's unfortunate. My nurse is sending her insulin  needles to the [facility] here, which we usually have better success with insurance navigation for whatever reason. My nurse spoke with her this morning about it. If we can't get that done, will switch her to NPH and regular  When do you want the twice weekly testing to start? Next week? ----- Message ----- From: William Glenn, DO Sent: 04/13/2024   2:47 PM EST To: Rollo ONEIDA Bring, MD  Hi Katie,   I saw you recommended inpatient management for this patient.  Unfortunately she could not arrange childcare.  I saw her today and it looks like she is having difficulty getting insurance to cover her needles for her insulin  pens.  I am going to add metformin  today to help increase her insulin  sensitivity,   But she definitely still needs insulin .  I wonder if switching to NPH and regular insulin  with traditional needles and syringes would be more affordable for her?  Can you check with the diabetic educator to see if this will be the case?  Or perhaps we need to try to prescribe her traditional needles and syringes to see if its covered.  I also think she needs twice weekly antenatal testing.  Can you guys arrange an NST weekly in your office when you see her at the [facility] visit?    Please let me know if you have other thoughts,  BNS

## 2024-04-19 NOTE — Telephone Encounter (Signed)
 LM with patient to call back.  Wanting to confirm she has the needle caps and is taking insulin  as directed.  Erminio DELENA Rumps, RN

## 2024-04-20 ENCOUNTER — Ambulatory Visit

## 2024-04-20 VITALS — BP 115/65 | HR 90 | Wt 305.0 lb

## 2024-04-20 DIAGNOSIS — Z3A30 30 weeks gestation of pregnancy: Secondary | ICD-10-CM

## 2024-04-20 NOTE — Progress Notes (Unsigned)
Patient presents for NST. 

## 2024-04-27 ENCOUNTER — Ambulatory Visit: Admitting: Medical

## 2024-04-27 ENCOUNTER — Encounter: Payer: Self-pay | Admitting: Medical

## 2024-04-27 VITALS — BP 92/55 | HR 92 | Wt 310.0 lb

## 2024-04-27 DIAGNOSIS — O99210 Obesity complicating pregnancy, unspecified trimester: Secondary | ICD-10-CM | POA: Diagnosis not present

## 2024-04-27 DIAGNOSIS — Z3490 Encounter for supervision of normal pregnancy, unspecified, unspecified trimester: Secondary | ICD-10-CM

## 2024-04-27 DIAGNOSIS — Z23 Encounter for immunization: Secondary | ICD-10-CM | POA: Diagnosis not present

## 2024-04-27 DIAGNOSIS — O24119 Pre-existing diabetes mellitus, type 2, in pregnancy, unspecified trimester: Secondary | ICD-10-CM | POA: Diagnosis not present

## 2024-04-27 DIAGNOSIS — O0990 Supervision of high risk pregnancy, unspecified, unspecified trimester: Secondary | ICD-10-CM

## 2024-04-27 DIAGNOSIS — Z3A31 31 weeks gestation of pregnancy: Secondary | ICD-10-CM

## 2024-04-27 DIAGNOSIS — Z2839 Other underimmunization status: Secondary | ICD-10-CM | POA: Diagnosis not present

## 2024-04-27 NOTE — Progress Notes (Signed)
 PRENATAL VISIT NOTE  Subjective:  Gina Lucas is a 28 y.o. [phone] at [redacted]w[redacted]d being seen today for ongoing prenatal care.  She is currently monitored for the following issues for this high-risk pregnancy and has History of recurrent miscarriages; Supervision of high risk pregnancy, antepartum; Rubella non-immune status, antepartum; Type 2 diabetes mellitus affecting pregnancy, antepartum; and Obesity in pregnancy on their problem list.  Patient reports occasional contractions and pelvic pressure and mucous discharge.  Contractions: Irritability. Vag. Bleeding: None.  Movement: Present. Denies leaking of fluid.   The following portions of the patient's history were reviewed and updated as appropriate: allergies, current medications, past family history, past medical history, past social history, past surgical history and problem list.   Objective:   Vitals:   04/27/24 1235  BP: (!) 92/55  Pulse: 92  Weight: (!) 310 lb (140.6 kg)    Fetal Status:      Movement: Present    General: Alert, oriented and cooperative. Patient is in no acute distress.  Skin: Skin is warm and dry. No rash noted.   Cardiovascular: Normal heart rate noted  Respiratory: Normal respiratory effort, no problems with respiration noted  Abdomen: Soft, gravid, appropriate for gestational age.  Pain/Pressure: Absent     Pelvic: Cervical exam deferred        Extremities: Normal range of motion.  Edema: None  Mental Status: Normal mood and affect. Normal behavior. Normal judgment and thought content.      12/24/2023    8:51 AM 04/03/2022   10:10 AM 12/28/2020    9:13 AM  Depression screen PHQ 2/9  Decreased Interest 0 1 1  Down, Depressed, Hopeless 0 2 1  PHQ - 2 Score 0 3 2  Altered sleeping 2 2 2   Tired, decreased energy 2 1 1   Change in appetite 2 2 2   Feeling bad or failure about yourself  0 2 1  Trouble concentrating 0 2 3  Moving slowly or fidgety/restless 0 0 0  Suicidal thoughts 0 1 0  PHQ-9 Score 6   13  11       Data saved with a previous flowsheet row definition        12/24/2023    8:51 AM 04/03/2022   10:10 AM 12/28/2020    9:14 AM 11/01/2020    8:47 AM  GAD 7 : Generalized Anxiety Score  Nervous, Anxious, on Edge 0 0 3 0  Control/stop worrying 0 0 2 0  Worry too much - different things 0 0 3 0  Trouble relaxing 0 0 2 0  Restless 0 0 0 0  Easily annoyed or irritable 3 2 3 1   Afraid - awful might happen 0 0 1 0  Total GAD 7 Score 3 2 14 1     Assessment and Plan:  Pregnancy: [id number] at 109w0d 1. Supervision of high risk pregnancy, antepartum (Primary) - CBC - HIV Antibody (routine testing w rflx) - RPR W/RFLX TO RPR TITER, TREPONEMAL AB, SCREEN AND DIAGNOSIS - Tdap vaccine greater than or equal to 7yo IM - Patient declined flu shot today   2. Obesity in pregnancy  3. Rubella non-immune status, antepartum - Plan for PP MMR  4. Type 2 diabetes mellitus affecting pregnancy, antepartum - Currently increasing insulin  2 units q 2 days if CBGs are not controlled. Currently taking 30 units Novolog  with meals and 32 units Lantus  BID. Will continue to increase and send log in 7 days for review.  - Currently using CGM -  Log reviewed. No values are in range.   5. [redacted] weeks gestation of pregnancy  Preterm labor symptoms and general obstetric precautions including but not limited to vaginal bleeding, contractions, leaking of fluid and fetal movement were reviewed in detail with the patient. Please refer to After Visit Summary for other counseling recommendations.   Return in about 1 week (around 05/04/2024) for NST, 2 weeks with any provider.  Future Appointments  Date Time Provider Department Center  05/06/2024  1:15 PM [facility] PROVIDER 1 [facility]  05/06/2024  1:30 PM [facility] US4 [facility]  05/10/2024  8:15 AM Eveline Lynwood MATSU, MD [facility] None  05/18/2024  1:15 PM [facility] PROVIDER 1 [facility]  05/18/2024  1:30 PM [facility] US2 [facility]  05/25/2024  1:15 PM [facility]  PROVIDER 1 [facility]  05/25/2024  1:30 PM [facility] US1 [facility]    Mliss Rinne, PA-C

## 2024-04-28 LAB — CBC
Hematocrit: 35.3 % (ref 34.0–46.6)
Hemoglobin: 11.6 g/dL (ref 11.1–15.9)
MCH: 28 pg (ref 26.6–33.0)
MCHC: 32.9 g/dL (ref 31.5–35.7)
MCV: 85 fL (ref 79–97)
Platelets: 218 x10E3/uL (ref 150–450)
RBC: 4.15 x10E6/uL (ref 3.77–5.28)
RDW: 12.3 % (ref 11.7–15.4)
WBC: 10.3 x10E3/uL (ref 3.4–10.8)

## 2024-04-28 LAB — SYPHILIS: RPR W/REFLEX TO RPR TITER AND TREPONEMAL ANTIBODIES, TRADITIONAL SCREENING AND DIAGNOSIS ALGORITHM: RPR Ser Ql: NONREACTIVE

## 2024-04-28 LAB — HIV ANTIBODY (ROUTINE TESTING W REFLEX): HIV Screen 4th Generation wRfx: NONREACTIVE

## 2024-04-30 DIAGNOSIS — Z419 Encounter for procedure for purposes other than remedying health state, unspecified: Secondary | ICD-10-CM | POA: Diagnosis not present

## 2024-05-02 ENCOUNTER — Ambulatory Visit: Payer: Self-pay | Admitting: Medical

## 2024-05-06 ENCOUNTER — Ambulatory Visit: Attending: Obstetrics

## 2024-05-06 ENCOUNTER — Ambulatory Visit

## 2024-05-06 ENCOUNTER — Other Ambulatory Visit: Payer: Self-pay | Admitting: *Deleted

## 2024-05-06 VITALS — BP 118/79 | HR 86

## 2024-05-06 DIAGNOSIS — O99213 Obesity complicating pregnancy, third trimester: Secondary | ICD-10-CM

## 2024-05-06 DIAGNOSIS — O24113 Pre-existing diabetes mellitus, type 2, in pregnancy, third trimester: Secondary | ICD-10-CM

## 2024-05-06 DIAGNOSIS — O24414 Gestational diabetes mellitus in pregnancy, insulin controlled: Secondary | ICD-10-CM

## 2024-05-06 DIAGNOSIS — E669 Obesity, unspecified: Secondary | ICD-10-CM | POA: Diagnosis not present

## 2024-05-06 DIAGNOSIS — O24313 Unspecified pre-existing diabetes mellitus in pregnancy, third trimester: Secondary | ICD-10-CM | POA: Diagnosis not present

## 2024-05-06 DIAGNOSIS — O24319 Unspecified pre-existing diabetes mellitus in pregnancy, unspecified trimester: Secondary | ICD-10-CM | POA: Diagnosis not present

## 2024-05-06 DIAGNOSIS — Z3A32 32 weeks gestation of pregnancy: Secondary | ICD-10-CM | POA: Diagnosis not present

## 2024-05-06 DIAGNOSIS — E119 Type 2 diabetes mellitus without complications: Secondary | ICD-10-CM | POA: Diagnosis not present

## 2024-05-06 DIAGNOSIS — O24119 Pre-existing diabetes mellitus, type 2, in pregnancy, unspecified trimester: Secondary | ICD-10-CM

## 2024-05-06 NOTE — Progress Notes (Signed)
 MFM Consult Note  Gina Lucas is currently at [redacted]w[redacted]d. She has been followed due to maternal obesity with a BMI of 47.6 and pregestational diabetes treated with insulin .    She reports that her blood glucose levels remain elevated with fasting values as high as 130 and 2-hour postprandial values mostly between the 140s to 160s range.    She has been increasing her insulin  dosage by 2 units every 2 days as her blood glucose levels are not under good control.    She denies any problems since her last exam and reports feeling fetal movements throughout the day.  Her blood pressure today was 118/79.  Sonographic findings Single intrauterine pregnancy at 32w 2d. Fetal cardiac activity: Observed. Presentation: Cephalic. Fetal biometry shows the estimated fetal weight of 5 lb 12 oz,  2614g (> 99%). Amniotic fluid: Within normal limits. AFI: 21.64 cm.  MVP: 7.35 cm. Placenta: Posterior. BPP: 8/8.   As her fingerstick values remain elevated, she was advised to increase the dosage of her short and long-acting insulin  by 4 units every 2 days.  She will try this out for the next week.    Should her fingerstick values remain elevated despite the increase in insulin , consideration should be given to hospitalization for glycemic control.  Due to the large for gestational age fetus and her extremely difficult to control blood glucose levels, delivery should be considered at between 36 to 37 weeks.  She should continue to be followed with weekly fetal testing until delivery.  Consideration should be given to increasing the frequency of fetal testing to twice weekly should her fingerstick values remain elevated.    Fetal kick count instructions were reviewed.  She should have an NST performed next week.  The patient stated that all of her questions were answered.   A total of 30 minutes was spent counseling and coordinating the care for this patient.  Greater than 50% of the time was spent in direct  face-to-face contact.

## 2024-05-07 ENCOUNTER — Telehealth: Payer: Self-pay

## 2024-05-07 NOTE — Telephone Encounter (Signed)
 Patient called in wanting to discuss some concerns she has after her last appointment at MFM. She asked about Dr. Tiffany availability and I informed her that her will not be back in the office until the beginning of January.   Informed her that I can route him a message stating that she'd like to discuss concerns with him Patient stated she's most comfortable with him and trusts his opinion. Routing to Dr. Barbra so that he is aware.

## 2024-05-10 ENCOUNTER — Other Ambulatory Visit (HOSPITAL_COMMUNITY)
Admission: RE | Admit: 2024-05-10 | Discharge: 2024-05-10 | Disposition: A | Discharge status: Home / Self Care | Source: Ambulatory Visit | Attending: Obstetrics & Gynecology | Admitting: Obstetrics & Gynecology

## 2024-05-10 ENCOUNTER — Ambulatory Visit: Admitting: Obstetrics & Gynecology

## 2024-05-10 VITALS — BP 117/67 | HR 87 | Wt 311.1 lb

## 2024-05-10 DIAGNOSIS — O0990 Supervision of high risk pregnancy, unspecified, unspecified trimester: Secondary | ICD-10-CM | POA: Diagnosis not present

## 2024-05-10 DIAGNOSIS — N898 Other specified noninflammatory disorders of vagina: Secondary | ICD-10-CM | POA: Diagnosis not present

## 2024-05-10 DIAGNOSIS — O99210 Obesity complicating pregnancy, unspecified trimester: Secondary | ICD-10-CM | POA: Diagnosis not present

## 2024-05-10 DIAGNOSIS — Z3A32 32 weeks gestation of pregnancy: Secondary | ICD-10-CM | POA: Diagnosis not present

## 2024-05-10 DIAGNOSIS — O24119 Pre-existing diabetes mellitus, type 2, in pregnancy, unspecified trimester: Secondary | ICD-10-CM | POA: Diagnosis not present

## 2024-05-10 NOTE — Progress Notes (Signed)
 "  PRENATAL VISIT NOTE  Subjective:  Gina Lucas is a 28 y.o. [phone] at [redacted]w[redacted]d being seen today for ongoing prenatal care.  She is currently monitored for the following issues for this high-risk pregnancy and has History of recurrent miscarriages; Supervision of high risk pregnancy, antepartum; Rubella non-immune status, antepartum; Type 2 diabetes mellitus affecting pregnancy, antepartum; and Obesity in pregnancy on their problem list.  Patient reports no complaints.  Contractions: Irregular. Vag. Bleeding: None.  Movement: Present. Denies leaking of fluid.   The following portions of the patient's history were reviewed and updated as appropriate: allergies, current medications, past family history, past medical history, past social history, past surgical history and problem list.   Objective:   Vitals:   05/10/24 0825  BP: 117/67  Pulse: 87  Weight: (!) 311 lb 1.3 oz (141.1 kg)    Fetal Status:      Movement: Present    General: Alert, oriented and cooperative. Patient is in no acute distress.  Skin: Skin is warm and dry. No rash noted.   Cardiovascular: Normal heart rate noted  Respiratory: Normal respiratory effort, no problems with respiration noted  Abdomen: Soft, gravid, appropriate for gestational age.  Pain/Pressure: Absent     Pelvic: Cervical exam deferred        Extremities: Normal range of motion.  Edema: Trace  Mental Status: Normal mood and affect. Normal behavior. Normal judgment and thought content.      12/24/2023    8:51 AM 04/03/2022   10:10 AM 12/28/2020    9:13 AM  Depression screen PHQ 2/9  Decreased Interest 0 1 1  Down, Depressed, Hopeless 0 2 1  PHQ - 2 Score 0 3 2  Altered sleeping 2 2 2   Tired, decreased energy 2 1 1   Change in appetite 2 2 2   Feeling bad or failure about yourself  0 2 1  Trouble concentrating 0 2 3  Moving slowly or fidgety/restless 0 0 0  Suicidal thoughts 0 1 0  PHQ-9 Score 6  13  11       Data saved with a previous  flowsheet row definition        12/24/2023    8:51 AM 04/03/2022   10:10 AM 12/28/2020    9:14 AM 11/01/2020    8:47 AM  GAD 7 : Generalized Anxiety Score  Nervous, Anxious, on Edge 0 0 3 0  Control/stop worrying 0 0 2 0  Worry too much - different things 0 0 3 0  Trouble relaxing 0 0 2 0  Restless 0 0 0 0  Easily annoyed or irritable 3 2 3 1   Afraid - awful might happen 0 0 1 0  Total GAD 7 Score 3 2 14 1     Assessment and Plan:  Pregnancy: [id number] at [redacted]w[redacted]d 1. [redacted] weeks gestation of pregnancy (Primary)   2. Vaginal irritation  - Cervicovaginal ancillary only( Frederica)  3. Type 2 diabetes mellitus affecting pregnancy, antepartum FBS 118, PP 130 per patient after adjusting insulin  per Dr. Ileana last week  4. Obesity in pregnancy   5. Supervision of high risk pregnancy, antepartum   Preterm labor symptoms and general obstetric precautions including but not limited to vaginal bleeding, contractions, leaking of fluid and fetal movement were reviewed in detail with the patient. Please refer to After Visit Summary for other counseling recommendations.   No follow-ups on file.  Future Appointments  Date Time Provider Department Center  05/18/2024  1:15 PM [facility]  PROVIDER 1 [facility]  05/18/2024  1:30 PM [facility] US2 [facility]  05/25/2024  1:15 PM [facility] PROVIDER 1 [facility]  05/25/2024  1:30 PM [facility] US1 [facility]  05/27/2024  1:10 PM Barbra Lang PARAS, DO [facility] None  06/03/2024  1:15 PM [facility] PROVIDER 1 [facility]  06/03/2024  1:30 PM [facility] US4 [facility]  06/10/2024  1:15 PM [facility] PROVIDER 1 [facility]  06/10/2024  1:30 PM [facility] US2 [facility]    Lynwood Solomons, MD "

## 2024-05-11 LAB — CERVICOVAGINAL ANCILLARY ONLY
Bacterial Vaginitis (gardnerella): NEGATIVE
Candida Glabrata: NEGATIVE
Candida Vaginitis: POSITIVE — AB
Comment: NEGATIVE
Comment: NEGATIVE
Comment: NEGATIVE

## 2024-05-11 NOTE — Telephone Encounter (Signed)
 CAlled patient - discussed recent US  - LGA with EFW in 99%tile. She was wondering if needed to deliver before 37 weeks. Discussed that at this point, no indication for delivery before then. Increase Lantus  to 70 units BID. She is continuing to increase her lantus  and premeal insulin . Will continue to monitor closely.

## 2024-05-18 ENCOUNTER — Other Ambulatory Visit

## 2024-05-18 ENCOUNTER — Ambulatory Visit

## 2024-05-18 ENCOUNTER — Other Ambulatory Visit: Payer: Self-pay

## 2024-05-18 DIAGNOSIS — B379 Candidiasis, unspecified: Secondary | ICD-10-CM

## 2024-05-18 MED ORDER — FLUCONAZOLE 150 MG PO TABS: 150.0000 mg | ORAL_TABLET | Freq: Once | ORAL | 0 refills | Status: AC

## 2024-05-25 ENCOUNTER — Other Ambulatory Visit

## 2024-05-25 ENCOUNTER — Ambulatory Visit: Attending: Obstetrics | Admitting: Obstetrics

## 2024-05-25 ENCOUNTER — Ambulatory Visit

## 2024-05-25 DIAGNOSIS — O24119 Pre-existing diabetes mellitus, type 2, in pregnancy, unspecified trimester: Secondary | ICD-10-CM

## 2024-05-25 DIAGNOSIS — O24113 Pre-existing diabetes mellitus, type 2, in pregnancy, third trimester: Secondary | ICD-10-CM | POA: Diagnosis present

## 2024-05-25 DIAGNOSIS — O99213 Obesity complicating pregnancy, third trimester: Secondary | ICD-10-CM

## 2024-05-25 DIAGNOSIS — Z3A35 35 weeks gestation of pregnancy: Secondary | ICD-10-CM | POA: Diagnosis not present

## 2024-05-25 DIAGNOSIS — O24319 Unspecified pre-existing diabetes mellitus in pregnancy, unspecified trimester: Secondary | ICD-10-CM

## 2024-05-25 DIAGNOSIS — Z794 Long term (current) use of insulin: Secondary | ICD-10-CM | POA: Diagnosis not present

## 2024-05-25 NOTE — Procedures (Signed)
 Gina Lucas 1996-05-10 [redacted]w[redacted]d  Fetus A Non-Stress Test Interpretation for 05/25/2024  Indication: Diabetes - NST only  Fetal Heart Rate A Mode: External Baseline Rate (A): 135 bpm Variability: Moderate Accelerations: 15 x 15 Decelerations: None Multiple birth?: No  Uterine Activity Mode: Toco, Palpation Contraction Frequency (min): none noted Resting Tone Palpated: Relaxed  Interpretation (Fetal Testing) Nonstress Test Interpretation: Reactive Comments: Reviewed with Dr. Ileana

## 2024-05-25 NOTE — Progress Notes (Signed)
 MFM Consult Note  Gina Lucas is currently at [redacted]w[redacted]d. She has been followed due to maternal obesity with a BMI of 47.6 and difficult to control pregestational diabetes.    She reports that her fasting fingerstick values are now in the 80s to 100s range and her 2-hour postprandial fingerstick values are in the 120s-140s range.    She denies any problems since her last exam and reports feeling fetal movements throughout the day.  She is currently treated with Lantus  insulin  76 units twice a day and NovoLog  insulin  72 units 3 times a day.  She had a reactive NST today.  A modified BPP performed today showed polyhydramnios with a total AFI of 30.2 cm.  The fetus was in the vertex presentation.  Fetal movements were noted throughout her ultrasound exam.  She was advised to continue using insulin  as prescribed for glycemic control.    Should her diabetes continue to be difficult to control, delivery should be scheduled at around 37 weeks.    She will return in 1 week for growth scan and BPP.  The patient stated that all of her questions were answered.   A total of 20 minutes was spent counseling and coordinating the care for this patient.  Greater than 50% of the time was spent in direct face-to-face contact.

## 2024-05-27 ENCOUNTER — Other Ambulatory Visit (HOSPITAL_COMMUNITY)
Admission: RE | Admit: 2024-05-27 | Discharge: 2024-05-27 | Disposition: A | Source: Ambulatory Visit | Attending: Family Medicine | Admitting: Family Medicine

## 2024-05-27 ENCOUNTER — Ambulatory Visit: Admitting: Family Medicine

## 2024-05-27 VITALS — BP 119/81 | HR 88 | Wt 324.0 lb

## 2024-05-27 DIAGNOSIS — O0993 Supervision of high risk pregnancy, unspecified, third trimester: Secondary | ICD-10-CM | POA: Diagnosis present

## 2024-05-27 DIAGNOSIS — O0990 Supervision of high risk pregnancy, unspecified, unspecified trimester: Secondary | ICD-10-CM | POA: Diagnosis not present

## 2024-05-27 DIAGNOSIS — Z8632 Personal history of gestational diabetes: Secondary | ICD-10-CM | POA: Diagnosis not present

## 2024-05-27 DIAGNOSIS — Z2839 Other underimmunization status: Secondary | ICD-10-CM | POA: Diagnosis not present

## 2024-05-27 DIAGNOSIS — Z3A35 35 weeks gestation of pregnancy: Secondary | ICD-10-CM | POA: Diagnosis not present

## 2024-05-27 DIAGNOSIS — O99210 Obesity complicating pregnancy, unspecified trimester: Secondary | ICD-10-CM | POA: Diagnosis not present

## 2024-05-27 DIAGNOSIS — O24119 Pre-existing diabetes mellitus, type 2, in pregnancy, unspecified trimester: Secondary | ICD-10-CM | POA: Diagnosis not present

## 2024-05-27 NOTE — Progress Notes (Signed)
 "  PRENATAL VISIT NOTE  Subjective:  Gina Lucas is a 29 y.o. [phone] at [redacted]w[redacted]d being seen today for ongoing prenatal care.  She is currently monitored for the following issues for this high-risk pregnancy and has History of recurrent miscarriages; Supervision of high risk pregnancy, antepartum; Rubella non-immune status, antepartum; Type 2 diabetes mellitus affecting pregnancy, antepartum; Obesity in pregnancy; and History of gestational diabetes on their problem list.  Patient reports very uncomfortable.  Contractions: Irritability. Vag. Bleeding: None.  Movement: Present. Denies leaking of fluid.   The following portions of the patient's history were reviewed and updated as appropriate: allergies, current medications, past family history, past medical history, past social history, past surgical history and problem list.   Objective:   Vitals:   05/27/24 1309  BP: 119/81  Pulse: 88  Weight: (!) 324 lb (147 kg)    Fetal Status:  Fetal Heart Rate (bpm): 142   Movement: Present Presentation: Vertex  General: Alert, oriented and cooperative. Patient is in no acute distress.  Skin: Skin is warm and dry. No rash noted.   Cardiovascular: Normal heart rate noted  Respiratory: Normal respiratory effort, no problems with respiration noted  Abdomen: Soft, gravid, appropriate for gestational age.  Pain/Pressure: Present     Pelvic: Cervical exam performed in the presence of a chaperone Dilation: 3 Effacement (%): 50 Station: -3  Extremities: Normal range of motion.  Edema: Trace  Mental Status: Normal mood and affect. Normal behavior. Normal judgment and thought content.      12/24/2023    8:51 AM 04/03/2022   10:10 AM 12/28/2020    9:13 AM  Depression screen PHQ 2/9  Decreased Interest 0 1 1  Down, Depressed, Hopeless 0 2 1  PHQ - 2 Score 0 3 2  Altered sleeping 2 2 2   Tired, decreased energy 2 1 1   Change in appetite 2 2 2   Feeling bad or failure about yourself  0 2 1  Trouble  concentrating 0 2 3  Moving slowly or fidgety/restless 0 0 0  Suicidal thoughts 0 1 0  PHQ-9 Score 6  13  11       Data saved with a previous flowsheet row definition        12/24/2023    8:51 AM 04/03/2022   10:10 AM 12/28/2020    9:14 AM 11/01/2020    8:47 AM  GAD 7 : Generalized Anxiety Score  Nervous, Anxious, on Edge 0 0 3 0  Control/stop worrying 0 0 2 0  Worry too much - different things 0 0 3 0  Trouble relaxing 0 0 2 0  Restless 0 0 0 0  Easily annoyed or irritable 3 2 3 1   Afraid - awful might happen 0 0 1 0  Total GAD 7 Score 3 2 14 1     Assessment and Plan:  Pregnancy: [id number] at [redacted]w[redacted]d 1. [redacted] weeks gestation of pregnancy (Primary) FHT normal GBS today  2. Supervision of high risk pregnancy, antepartum FHT normal  3. Type 2 diabetes mellitus affecting pregnancy, antepartum Lantus  78 units BID SA insuin 74 TID Continue to titrate. Add metformin  for sensitivity CGM data reviewed: variability 21.2%, average glucose 147   4. Rubella non-immune status, antepartum   5. Obesity in pregnancy  6. History of gestational diabetes   Term labor symptoms and general obstetric precautions including but not limited to vaginal bleeding, contractions, leaking of fluid and fetal movement were reviewed in detail with the patient. Please refer to  After Visit Summary for other counseling recommendations.   No follow-ups on file.  Future Appointments  Date Time Provider Department Center  06/03/2024  1:15 PM [facility] PROVIDER 1 [facility]  06/03/2024  1:30 PM [facility] US4 [facility]  06/08/2024  7:15 AM [facility] SCHED ROOM [facility] None  06/10/2024  1:15 PM [facility] PROVIDER 1 [facility]  06/10/2024  1:30 PM [facility] US2 [facility]    Lang JINNY Peel, DO  "

## 2024-05-28 ENCOUNTER — Encounter (HOSPITAL_COMMUNITY): Payer: Self-pay | Admitting: Obstetrics and Gynecology

## 2024-05-28 ENCOUNTER — Inpatient Hospital Stay (HOSPITAL_COMMUNITY)
Admission: AD | Admit: 2024-05-28 | Discharge: 2024-05-29 | Disposition: A | Attending: Obstetrics and Gynecology | Admitting: Obstetrics and Gynecology

## 2024-05-28 ENCOUNTER — Other Ambulatory Visit: Payer: Self-pay

## 2024-05-28 DIAGNOSIS — O24113 Pre-existing diabetes mellitus, type 2, in pregnancy, third trimester: Secondary | ICD-10-CM | POA: Insufficient documentation

## 2024-05-28 DIAGNOSIS — O26893 Other specified pregnancy related conditions, third trimester: Secondary | ICD-10-CM | POA: Diagnosis present

## 2024-05-28 DIAGNOSIS — R030 Elevated blood-pressure reading, without diagnosis of hypertension: Secondary | ICD-10-CM | POA: Diagnosis not present

## 2024-05-28 DIAGNOSIS — Z794 Long term (current) use of insulin: Secondary | ICD-10-CM | POA: Insufficient documentation

## 2024-05-28 DIAGNOSIS — O99213 Obesity complicating pregnancy, third trimester: Secondary | ICD-10-CM | POA: Insufficient documentation

## 2024-05-28 DIAGNOSIS — R519 Headache, unspecified: Secondary | ICD-10-CM | POA: Diagnosis not present

## 2024-05-28 DIAGNOSIS — Z3A35 35 weeks gestation of pregnancy: Secondary | ICD-10-CM | POA: Insufficient documentation

## 2024-05-28 DIAGNOSIS — Z7984 Long term (current) use of oral hypoglycemic drugs: Secondary | ICD-10-CM | POA: Insufficient documentation

## 2024-05-28 LAB — CBC
HCT: 31.8 % — ABNORMAL LOW (ref 36.0–46.0)
Hemoglobin: 10.2 g/dL — ABNORMAL LOW (ref 12.0–15.0)
MCH: 26.3 pg (ref 26.0–34.0)
MCHC: 32.1 g/dL (ref 30.0–36.0)
MCV: 82 fL (ref 80.0–100.0)
Platelets: 188 K/uL (ref 150–400)
RBC: 3.88 MIL/uL (ref 3.87–5.11)
RDW: 13.2 % (ref 11.5–15.5)
WBC: 9.3 K/uL (ref 4.0–10.5)
nRBC: 0 % (ref 0.0–0.2)

## 2024-05-28 LAB — GC/CHLAMYDIA PROBE AMP (~~LOC~~) NOT AT ARMC
Chlamydia: NEGATIVE
Comment: NEGATIVE
Comment: NORMAL
Neisseria Gonorrhea: NEGATIVE

## 2024-05-28 MED ORDER — ACETAMINOPHEN-CAFFEINE 500-65 MG PO TABS
2.0000 | ORAL_TABLET | Freq: Once | ORAL | Status: AC
  Administered 2024-05-28: 2 via ORAL
  Filled 2024-05-28: qty 2

## 2024-05-28 MED ORDER — METOCLOPRAMIDE HCL 10 MG PO TABS
10.0000 mg | ORAL_TABLET | Freq: Once | ORAL | Status: AC
  Administered 2024-05-28: 10 mg via ORAL
  Filled 2024-05-28: qty 1
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
## 2024-05-28 NOTE — MAU Note (Signed)
 Gina Lucas is a 29 y.o. at [redacted]w[redacted]d here in MAU reporting: Bi-lateral LE swelling and a HA. Reports +FM, no LOF, no painful CTX and no VB or visual disturbances.  Pt has not taken any Tylenol  for her HA.   LMP:  Onset of complaint: yesterday  Pain score: 3/10    FHT: pt placed in MAU exam room   Lab orders placed from triage:

## 2024-05-29 DIAGNOSIS — Z3A35 35 weeks gestation of pregnancy: Secondary | ICD-10-CM

## 2024-05-29 DIAGNOSIS — O26893 Other specified pregnancy related conditions, third trimester: Secondary | ICD-10-CM | POA: Diagnosis not present

## 2024-05-29 DIAGNOSIS — R519 Headache, unspecified: Secondary | ICD-10-CM | POA: Diagnosis not present

## 2024-05-29 LAB — COMPREHENSIVE METABOLIC PANEL WITH GFR
ALT: 9 U/L (ref 0–44)
AST: 16 U/L (ref 15–41)
Albumin: 3.1 g/dL — ABNORMAL LOW (ref 3.5–5.0)
Alkaline Phosphatase: 104 U/L (ref 38–126)
Anion gap: 10 (ref 5–15)
BUN: 8 mg/dL (ref 6–20)
CO2: 19 mmol/L — ABNORMAL LOW (ref 22–32)
Calcium: 8.8 mg/dL — ABNORMAL LOW (ref 8.9–10.3)
Chloride: 106 mmol/L (ref 98–111)
Creatinine, Ser: 0.47 mg/dL (ref 0.44–1.00)
GFR, Estimated: >60 mL/min
Glucose, Bld: 141 mg/dL — ABNORMAL HIGH (ref 70–99)
Potassium: 4.1 mmol/L (ref 3.5–5.1)
Sodium: 135 mmol/L (ref 135–145)
Total Bilirubin: 0.4 mg/dL (ref 0.0–1.2)
Total Protein: 5.7 g/dL — ABNORMAL LOW (ref 6.5–8.1)

## 2024-05-29 LAB — PROTEIN / CREATININE RATIO, URINE
Creatinine, Urine: 126 mg/dL
Protein Creatinine Ratio: 0.2 mg/mg — ABNORMAL HIGH
Total Protein, Urine: 21 mg/dL

## 2024-05-29 LAB — STREP GP B NAA: Strep Gp B NAA: NEGATIVE

## 2024-05-29 NOTE — Discharge Instructions (Signed)
 Reasons to return to MAU at [facility]: Your blood pressure is >160/110. You have a headache that will not go away after having something to eat, something to drink, and Tylenol . You have changes in your vision or upper abdominal pain that does not get better with Tylenol . Less than 36 weeks: Contractions feels like menstrual cramps. You should go to the hospital if you have more than 6 contractions in an hour, even after you have rested and drank at least 16 ounces of water.  More than 36 weeks: You begin to have strong, frequent contractions 5 minutes apart or less, each last 1 minute, these have been going on for 1-2 hours, and you cannot walk or talk during them. Your water breaks.  Sometimes it is a big gush of fluid. However, many times it may it may be much more subtle. You should go to the hospital if you have a constant leakage of fluid from your vagina, enough to soak a pad when you are walking around.  You have vaginal bleeding.  It is normal to have a small amount of spotting if your cervix was checked. If you have bleeding requiring the use of a pad, go to the hospital. You don't feel your baby moving like normal.  If you think that you baby's movement is decreased, eat a snack and rest on your left side in a quiet room for one hour. If you have not felt the baby move more than 6 times in an hour GO TO THE HOSPITAL.

## 2024-05-31 ENCOUNTER — Ambulatory Visit: Payer: Self-pay | Admitting: Family Medicine

## 2024-05-31 DIAGNOSIS — O0990 Supervision of high risk pregnancy, unspecified, unspecified trimester: Secondary | ICD-10-CM

## 2024-06-02 ENCOUNTER — Telehealth (HOSPITAL_COMMUNITY): Payer: Self-pay | Admitting: *Deleted

## 2024-06-02 ENCOUNTER — Encounter (HOSPITAL_COMMUNITY): Payer: Self-pay | Admitting: *Deleted

## 2024-06-02 NOTE — Telephone Encounter (Signed)
 Preadmission screen

## 2024-06-03 ENCOUNTER — Other Ambulatory Visit

## 2024-06-03 ENCOUNTER — Ambulatory Visit: Attending: Obstetrics and Gynecology | Admitting: Obstetrics and Gynecology

## 2024-06-03 ENCOUNTER — Ambulatory Visit (INDEPENDENT_AMBULATORY_CARE_PROVIDER_SITE_OTHER): Admitting: Family Medicine

## 2024-06-03 VITALS — BP 137/75 | HR 79

## 2024-06-03 VITALS — BP 119/95 | HR 92 | Wt 331.0 lb

## 2024-06-03 DIAGNOSIS — O24119 Pre-existing diabetes mellitus, type 2, in pregnancy, unspecified trimester: Secondary | ICD-10-CM

## 2024-06-03 DIAGNOSIS — Z3A36 36 weeks gestation of pregnancy: Secondary | ICD-10-CM | POA: Diagnosis not present

## 2024-06-03 DIAGNOSIS — Z7984 Long term (current) use of oral hypoglycemic drugs: Secondary | ICD-10-CM | POA: Diagnosis not present

## 2024-06-03 DIAGNOSIS — E1165 Type 2 diabetes mellitus with hyperglycemia: Secondary | ICD-10-CM | POA: Insufficient documentation

## 2024-06-03 DIAGNOSIS — O99213 Obesity complicating pregnancy, third trimester: Secondary | ICD-10-CM

## 2024-06-03 DIAGNOSIS — O133 Gestational [pregnancy-induced] hypertension without significant proteinuria, third trimester: Secondary | ICD-10-CM | POA: Insufficient documentation

## 2024-06-03 DIAGNOSIS — O99210 Obesity complicating pregnancy, unspecified trimester: Secondary | ICD-10-CM | POA: Diagnosis not present

## 2024-06-03 DIAGNOSIS — O24113 Pre-existing diabetes mellitus, type 2, in pregnancy, third trimester: Secondary | ICD-10-CM

## 2024-06-03 DIAGNOSIS — O09293 Supervision of pregnancy with other poor reproductive or obstetric history, third trimester: Secondary | ICD-10-CM

## 2024-06-03 DIAGNOSIS — E119 Type 2 diabetes mellitus without complications: Secondary | ICD-10-CM

## 2024-06-03 DIAGNOSIS — Z794 Long term (current) use of insulin: Secondary | ICD-10-CM | POA: Diagnosis not present

## 2024-06-03 DIAGNOSIS — Z362 Encounter for other antenatal screening follow-up: Secondary | ICD-10-CM | POA: Insufficient documentation

## 2024-06-03 DIAGNOSIS — Z2839 Other underimmunization status: Secondary | ICD-10-CM

## 2024-06-03 DIAGNOSIS — O403XX0 Polyhydramnios, third trimester, not applicable or unspecified: Secondary | ICD-10-CM

## 2024-06-03 DIAGNOSIS — N96 Recurrent pregnancy loss: Secondary | ICD-10-CM | POA: Diagnosis not present

## 2024-06-03 DIAGNOSIS — Z8632 Personal history of gestational diabetes: Secondary | ICD-10-CM | POA: Diagnosis not present

## 2024-06-03 DIAGNOSIS — O0990 Supervision of high risk pregnancy, unspecified, unspecified trimester: Secondary | ICD-10-CM | POA: Diagnosis not present

## 2024-06-03 DIAGNOSIS — Z3490 Encounter for supervision of normal pregnancy, unspecified, unspecified trimester: Secondary | ICD-10-CM

## 2024-06-03 DIAGNOSIS — E669 Obesity, unspecified: Secondary | ICD-10-CM | POA: Diagnosis not present

## 2024-06-03 NOTE — Progress Notes (Signed)
 "  PRENATAL VISIT NOTE  Subjective:  Gina Lucas is a 29 y.o. [phone] at [redacted]w[redacted]d being seen today for ongoing prenatal care.  She is currently monitored for the following issues for this high-risk pregnancy and has History of recurrent miscarriages; Supervision of high risk pregnancy, antepartum; Rubella non-immune status, antepartum; Type 2 diabetes mellitus affecting pregnancy, antepartum; Obesity in pregnancy; and History of gestational diabetes on their problem list.  Patient reports no complaints.  Contractions: Irregular. Vag. Bleeding: None.  Movement: Present. Denies leaking of fluid.   The following portions of the patient's history were reviewed and updated as appropriate: allergies, current medications, past family history, past medical history, past social history, past surgical history and problem list.   Objective:   Vitals:   06/03/24 1112 06/03/24 1115  BP: (!) 145/90 (!) 119/95  Pulse: 89 92  Weight: (!) 331 lb (150.1 kg)     Fetal Status:      Movement: Present    General: Alert, oriented and cooperative. Patient is in no acute distress.  Skin: Skin is warm and dry. No rash noted.   Cardiovascular: Normal heart rate noted  Respiratory: Normal respiratory effort, no problems with respiration noted  Abdomen: Soft, gravid, appropriate for gestational age.  Pain/Pressure: Present     Pelvic: Cervical exam deferred        Extremities: Normal range of motion.  Edema: Trace  Mental Status: Normal mood and affect. Normal behavior. Normal judgment and thought content.      12/24/2023    8:51 AM 04/03/2022   10:10 AM 12/28/2020    9:13 AM  Depression screen PHQ 2/9  Decreased Interest 0 1 1  Down, Depressed, Hopeless 0 2 1  PHQ - 2 Score 0 3 2  Altered sleeping 2 2 2   Tired, decreased energy 2 1 1   Change in appetite 2 2 2   Feeling bad or failure about yourself  0 2 1  Trouble concentrating 0 2 3  Moving slowly or fidgety/restless 0 0 0  Suicidal thoughts 0 1 0   PHQ-9 Score 6  13  11       Data saved with a previous flowsheet row definition        12/24/2023    8:51 AM 04/03/2022   10:10 AM 12/28/2020    9:14 AM 11/01/2020    8:47 AM  GAD 7 : Generalized Anxiety Score  Nervous, Anxious, on Edge 0 0 3 0  Control/stop worrying 0 0 2 0  Worry too much - different things 0 0 3 0  Trouble relaxing 0 0 2 0  Restless 0 0 0 0  Easily annoyed or irritable 3 2 3 1   Afraid - awful might happen 0 0 1 0  Total GAD 7 Score 3 2 14 1     Assessment and Plan:  Pregnancy: [id number] at [redacted]w[redacted]d 1. [redacted] weeks gestation of pregnancy (Primary) - Cervicovaginal ancillary only - Strep Gp B NAA  2. Supervision of high risk pregnancy, antepartum FHT normal  3. Type 2 diabetes mellitus affecting pregnancy, antepartum CGM data reviewed.  Time in target range: 79%.  21% high Average glucose 152 with Ac1 6.9% She's continuing to titrate insulin . Started metformin . Did have problem with the sensor for 3 days. Induction at 37 weeks BPP today  5. Rubella non-immune status, antepartum  6. Obesity in pregnancy  7. History of recurrent miscarriages  8. Gestational hypertension, third trimester BPs elevated today. Check CMP, CBC No severe symptoms. No  change in delivery date - still 37 weeks  Preterm labor symptoms and general obstetric precautions including but not limited to vaginal bleeding, contractions, leaking of fluid and fetal movement were reviewed in detail with the patient. Please refer to After Visit Summary for other counseling recommendations.   No follow-ups on file.  Future Appointments  Date Time Provider Department Center  06/03/2024  1:15 PM [facility] PROVIDER 1 [facility]  06/03/2024  1:30 PM [facility] US4 [facility]  06/08/2024  7:15 AM [facility] SCHED ROOM [facility] None  06/10/2024  1:15 PM [facility] PROVIDER 1 [facility]  06/10/2024  1:30 PM [facility] US2 [facility]  07/22/2024 10:15 AM Nemiah Bubar J, DO [facility] None    Hipolito Martinezlopez J Lauren Aguayo,  DO "

## 2024-06-03 NOTE — Progress Notes (Signed)
 Maternal-Fetal Medicine Consultation  Name: Gina Lucas  MRN: [id number]  GA: [id number] [redacted]w[redacted]d   Type 2 diabetes.  Patient takes insulin  and metformin .  Both fasting and postprandial levels are increased and her diabetes not well-controlled. Patient has new diagnosis of gestational hypertension.  Her blood pressure today at our office is 137/75 mmHg. She does not have signs and symptoms of severe features of preeclampsia. Patient will be undergoing induction of labor at [redacted] weeks gestation.  Ultrasound The estimated fetal weight is 4,276 g (99th percentile) and the abdominal circumference measurement is at the 99th percentile. Mild polyhydramnios is seen (AFI 28 cm).  Cephalic presentation.  Antenatal testing is reassuring.  BPP 8/8. I counseled the patient on the following:  Diabetes in pregnancy -Findings are consistent with fetal macrosomia. -Ultrasound has limitations in accurately estimating fetal weights and has a very low positive predictive value. -Birth weight is over 4,500 g in pregnancies complicated by diabetes carry an increased risk for perinatal mortality and morbidity. Shoulder dystocia and neurological injuries are more common at delivery. Since ultrasound has a 15% margin of error, patient was counseled that she has an option of undergoing elective cesarean delivery. Patient is keen on vaginal delivery and does not want cesarean delivery.  Gestational hypertension Patient has blood pressure cuff at home and did not have severe range blood pressures.  I discussed the parameters and advised her to come to the hospital if she has severe range hypertension. Patient has 2 risk factors including gestational hypertension and poorly controlled diabetes.  We recommend delivery at [redacted] weeks gestation.  Recommendations No follow-up appointments were made     Consultation including face-to-face (more than 50%) counseling 20 minutes.

## 2024-06-04 ENCOUNTER — Other Ambulatory Visit: Payer: Self-pay

## 2024-06-04 ENCOUNTER — Inpatient Hospital Stay (HOSPITAL_COMMUNITY)
Admission: AD | Admit: 2024-06-04 | Discharge: 2024-06-04 | Disposition: A | Discharge status: Home / Self Care | Attending: Obstetrics and Gynecology | Admitting: Obstetrics and Gynecology

## 2024-06-04 ENCOUNTER — Encounter (HOSPITAL_COMMUNITY): Payer: Self-pay | Admitting: Obstetrics and Gynecology

## 2024-06-04 ENCOUNTER — Ambulatory Visit: Payer: Self-pay | Admitting: Family Medicine

## 2024-06-04 DIAGNOSIS — E1165 Type 2 diabetes mellitus with hyperglycemia: Secondary | ICD-10-CM | POA: Diagnosis not present

## 2024-06-04 DIAGNOSIS — Z3A36 36 weeks gestation of pregnancy: Secondary | ICD-10-CM | POA: Diagnosis not present

## 2024-06-04 DIAGNOSIS — O24113 Pre-existing diabetes mellitus, type 2, in pregnancy, third trimester: Secondary | ICD-10-CM | POA: Diagnosis not present

## 2024-06-04 DIAGNOSIS — O133 Gestational [pregnancy-induced] hypertension without significant proteinuria, third trimester: Secondary | ICD-10-CM | POA: Diagnosis present

## 2024-06-04 DIAGNOSIS — H538 Other visual disturbances: Secondary | ICD-10-CM | POA: Diagnosis present

## 2024-06-04 DIAGNOSIS — R519 Headache, unspecified: Secondary | ICD-10-CM | POA: Diagnosis present

## 2024-06-04 DIAGNOSIS — O24119 Pre-existing diabetes mellitus, type 2, in pregnancy, unspecified trimester: Secondary | ICD-10-CM

## 2024-06-04 LAB — CBC WITH DIFFERENTIAL/PLATELET
Abs Immature Granulocytes: 0.04 K/uL (ref 0.00–0.07)
Basophils Absolute: 0 K/uL (ref 0.0–0.1)
Basophils Relative: 0 %
Eosinophils Absolute: 0.1 K/uL (ref 0.0–0.5)
Eosinophils Relative: 1 %
HCT: 31.8 % — ABNORMAL LOW (ref 36.0–46.0)
Hemoglobin: 10.3 g/dL — ABNORMAL LOW (ref 12.0–15.0)
Immature Granulocytes: 0 %
Lymphocytes Relative: 24 %
Lymphs Abs: 2.2 K/uL (ref 0.7–4.0)
MCH: 26.1 pg (ref 26.0–34.0)
MCHC: 32.4 g/dL (ref 30.0–36.0)
MCV: 80.5 fL (ref 80.0–100.0)
Monocytes Absolute: 0.6 K/uL (ref 0.1–1.0)
Monocytes Relative: 7 %
Neutro Abs: 6.2 K/uL (ref 1.7–7.7)
Neutrophils Relative %: 68 %
Platelets: 185 K/uL (ref 150–400)
RBC: 3.95 MIL/uL (ref 3.87–5.11)
RDW: 13.2 % (ref 11.5–15.5)
WBC: 9.2 K/uL (ref 4.0–10.5)
nRBC: 0 % (ref 0.0–0.2)

## 2024-06-04 LAB — URINALYSIS, ROUTINE W REFLEX MICROSCOPIC
Bilirubin Urine: NEGATIVE
Glucose, UA: >=500 mg/dL — AB
Hgb urine dipstick: NEGATIVE
Ketones, ur: 20 mg/dL — AB
Nitrite: NEGATIVE
Protein, ur: 30 mg/dL — AB
Specific Gravity, Urine: 1.027 (ref 1.005–1.030)
pH: 5 (ref 5.0–8.0)

## 2024-06-04 LAB — COMPREHENSIVE METABOLIC PANEL WITH GFR
ALT: 7 IU/L (ref 0–32)
ALT: 9 U/L (ref 0–44)
AST: 10 IU/L (ref 0–40)
AST: 14 U/L — ABNORMAL LOW (ref 15–41)
Albumin: 3.3 g/dL — ABNORMAL LOW (ref 4.0–5.0)
Albumin: 3.2 g/dL — ABNORMAL LOW (ref 3.5–5.0)
Alkaline Phosphatase: 131 IU/L — ABNORMAL HIGH (ref 41–116)
Alkaline Phosphatase: 121 U/L (ref 38–126)
Anion gap: 12 (ref 5–15)
BUN/Creatinine Ratio: 14 (ref 9–23)
BUN: 8 mg/dL (ref 6–20)
BUN: 9 mg/dL (ref 6–20)
Bilirubin Total: 0.5 mg/dL (ref 0.0–1.2)
CO2: 20 mmol/L (ref 20–29)
CO2: 18 mmol/L — ABNORMAL LOW (ref 22–32)
Calcium: 9.5 mg/dL (ref 8.7–10.2)
Calcium: 9 mg/dL (ref 8.9–10.3)
Chloride: 104 mmol/L (ref 96–106)
Chloride: 103 mmol/L (ref 98–111)
Creatinine, Ser: 0.59 mg/dL (ref 0.57–1.00)
Creatinine, Ser: 0.54 mg/dL (ref 0.44–1.00)
GFR, Estimated: >60 mL/min
Globulin, Total: 2.5 g/dL (ref 1.5–4.5)
Glucose, Bld: 186 mg/dL — ABNORMAL HIGH (ref 70–99)
Glucose: 215 mg/dL — ABNORMAL HIGH (ref 70–99)
Potassium: 4.5 mmol/L (ref 3.5–5.2)
Potassium: 3.9 mmol/L (ref 3.5–5.1)
Sodium: 138 mmol/L (ref 134–144)
Sodium: 133 mmol/L — ABNORMAL LOW (ref 135–145)
Total Bilirubin: 0.4 mg/dL (ref 0.0–1.2)
Total Protein: 5.8 g/dL — ABNORMAL LOW (ref 6.0–8.5)
Total Protein: 5.8 g/dL — ABNORMAL LOW (ref 6.5–8.1)
eGFR: 125 mL/min/1.73

## 2024-06-04 LAB — PROTEIN / CREATININE RATIO, URINE
Creatinine, Urine: 103.4 mg/dL
Creatinine, Urine: 159 mg/dL
Protein Creatinine Ratio: 0.2 mg/mg — ABNORMAL HIGH
Protein, Ur: 18.4 mg/dL
Protein/Creat Ratio: 178 mg/g{creat} (ref 0–200)
Total Protein, Urine: 26 mg/dL

## 2024-06-04 LAB — CBC
Hematocrit: 35.3 % (ref 34.0–46.6)
Hemoglobin: 11.2 g/dL (ref 11.1–15.9)
MCH: 25.7 pg — ABNORMAL LOW (ref 26.6–33.0)
MCHC: 31.7 g/dL (ref 31.5–35.7)
MCV: 81 fL (ref 79–97)
Platelets: 210 x10E3/uL (ref 150–450)
RBC: 4.36 x10E6/uL (ref 3.77–5.28)
RDW: 13 % (ref 11.7–15.4)
WBC: 9.9 x10E3/uL (ref 3.4–10.8)

## 2024-06-04 MED ORDER — ACETAMINOPHEN-CAFFEINE 500-65 MG PO TABS
2.0000 | ORAL_TABLET | Freq: Once | ORAL | Status: AC
  Administered 2024-06-04: 2 via ORAL
  Filled 2024-06-04: qty 2
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
## 2024-06-04 NOTE — MAU Note (Signed)
 Gina Lucas is a 29 y.o. at [redacted]w[redacted]d here in MAU reporting: she has been taking her BP's at home secondary GHTN diagnosis and BP's were elevated.  Denies epigastric pain, endorses current HA and blurred vision.  States took Tylenol  @ noon, not much relief noted.  Denies VB and LOF.  Reports +FM.  States having irregular ctxs.  LMP: 09/23/2023 Onset of complaint: today Pain score: 4 Vitals:   06/04/24 1513  BP: (!) 144/82  Pulse: 79  Resp: 20  Temp: 98.7 F (37.1 C)  SpO2: 98%     FHT: 140 bpm  Lab orders placed from triage: UA

## 2024-06-04 NOTE — Telephone Encounter (Signed)
 Advised patient to go to MAU for evaluation.  Erminio DELENA Rumps, RN
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
## 2024-06-08 ENCOUNTER — Inpatient Hospital Stay (HOSPITAL_COMMUNITY): Admitting: Anesthesiology

## 2024-06-08 ENCOUNTER — Inpatient Hospital Stay (HOSPITAL_COMMUNITY)
Admission: RE | Admit: 2024-06-08 | Discharge: 2024-06-10 | DRG: 788 | Disposition: A | Discharge status: Home / Self Care | Attending: Obstetrics and Gynecology | Admitting: Obstetrics and Gynecology

## 2024-06-08 ENCOUNTER — Inpatient Hospital Stay (HOSPITAL_COMMUNITY)

## 2024-06-08 ENCOUNTER — Encounter (HOSPITAL_COMMUNITY): Payer: Self-pay | Admitting: Obstetrics and Gynecology

## 2024-06-08 ENCOUNTER — Other Ambulatory Visit: Payer: Self-pay

## 2024-06-08 ENCOUNTER — Encounter (HOSPITAL_COMMUNITY): Admission: RE | Disposition: A | Payer: Self-pay | Source: Home / Self Care | Attending: Family Medicine

## 2024-06-08 DIAGNOSIS — O2412 Pre-existing diabetes mellitus, type 2, in childbirth: Principal | ICD-10-CM | POA: Diagnosis present

## 2024-06-08 DIAGNOSIS — E1165 Type 2 diabetes mellitus with hyperglycemia: Secondary | ICD-10-CM

## 2024-06-08 DIAGNOSIS — O3663X0 Maternal care for excessive fetal growth, third trimester, not applicable or unspecified: Secondary | ICD-10-CM | POA: Diagnosis present

## 2024-06-08 DIAGNOSIS — Z87891 Personal history of nicotine dependence: Secondary | ICD-10-CM

## 2024-06-08 DIAGNOSIS — O24429 Gestational diabetes mellitus in childbirth, unspecified control: Secondary | ICD-10-CM

## 2024-06-08 DIAGNOSIS — Z7984 Long term (current) use of oral hypoglycemic drugs: Secondary | ICD-10-CM

## 2024-06-08 DIAGNOSIS — O9902 Anemia complicating childbirth: Secondary | ICD-10-CM | POA: Diagnosis present

## 2024-06-08 DIAGNOSIS — O99214 Obesity complicating childbirth: Secondary | ICD-10-CM | POA: Diagnosis present

## 2024-06-08 DIAGNOSIS — O0990 Supervision of high risk pregnancy, unspecified, unspecified trimester: Principal | ICD-10-CM

## 2024-06-08 DIAGNOSIS — Z79899 Other long term (current) drug therapy: Secondary | ICD-10-CM

## 2024-06-08 DIAGNOSIS — Z833 Family history of diabetes mellitus: Secondary | ICD-10-CM | POA: Diagnosis not present

## 2024-06-08 DIAGNOSIS — O403XX0 Polyhydramnios, third trimester, not applicable or unspecified: Secondary | ICD-10-CM | POA: Diagnosis present

## 2024-06-08 DIAGNOSIS — O139 Gestational [pregnancy-induced] hypertension without significant proteinuria, unspecified trimester: Secondary | ICD-10-CM | POA: Diagnosis present

## 2024-06-08 DIAGNOSIS — E119 Type 2 diabetes mellitus without complications: Secondary | ICD-10-CM | POA: Diagnosis present

## 2024-06-08 DIAGNOSIS — Z3A37 37 weeks gestation of pregnancy: Secondary | ICD-10-CM

## 2024-06-08 DIAGNOSIS — Z8249 Family history of ischemic heart disease and other diseases of the circulatory system: Secondary | ICD-10-CM | POA: Diagnosis not present

## 2024-06-08 DIAGNOSIS — Z794 Long term (current) use of insulin: Secondary | ICD-10-CM | POA: Diagnosis not present

## 2024-06-08 DIAGNOSIS — Z98891 History of uterine scar from previous surgery: Secondary | ICD-10-CM

## 2024-06-08 DIAGNOSIS — O134 Gestational [pregnancy-induced] hypertension without significant proteinuria, complicating childbirth: Secondary | ICD-10-CM | POA: Diagnosis present

## 2024-06-08 DIAGNOSIS — O24119 Pre-existing diabetes mellitus, type 2, in pregnancy, unspecified trimester: Secondary | ICD-10-CM | POA: Diagnosis present

## 2024-06-08 DIAGNOSIS — O99210 Obesity complicating pregnancy, unspecified trimester: Secondary | ICD-10-CM | POA: Diagnosis present

## 2024-06-08 LAB — CBC
HCT: 31.3 % — ABNORMAL LOW (ref 36.0–46.0)
Hemoglobin: 10.2 g/dL — ABNORMAL LOW (ref 12.0–15.0)
MCH: 26 pg (ref 26.0–34.0)
MCHC: 32.6 g/dL (ref 30.0–36.0)
MCV: 79.8 fL — ABNORMAL LOW (ref 80.0–100.0)
Platelets: 173 K/uL (ref 150–400)
RBC: 3.92 MIL/uL (ref 3.87–5.11)
RDW: 13.3 % (ref 11.5–15.5)
WBC: 8.5 K/uL (ref 4.0–10.5)
nRBC: 0 % (ref 0.0–0.2)

## 2024-06-08 LAB — SYPHILIS: RPR W/REFLEX TO RPR TITER AND TREPONEMAL ANTIBODIES, TRADITIONAL SCREENING AND DIAGNOSIS ALGORITHM: RPR Ser Ql: NONREACTIVE

## 2024-06-08 LAB — GLUCOSE, CAPILLARY
Glucose-Capillary: 122 mg/dL — ABNORMAL HIGH (ref 70–99)
Glucose-Capillary: 167 mg/dL — ABNORMAL HIGH (ref 70–99)

## 2024-06-08 LAB — TYPE AND SCREEN
ABO/RH(D): O POS
Antibody Screen: NEGATIVE

## 2024-06-08 MED ORDER — PHENYLEPHRINE HCL-NACL 20-0.9 MG/250ML-% IV SOLN
INTRAVENOUS | Status: DC | PRN
  Administered 2024-06-08: 60 ug/min via INTRAVENOUS

## 2024-06-08 MED ORDER — FUROSEMIDE 20 MG PO TABS
20.0000 mg | ORAL_TABLET | Freq: Every day | ORAL | Status: DC
  Administered 2024-06-09 – 2024-06-10 (×2): 20 mg via ORAL
  Filled 2024-06-08 (×2): qty 1

## 2024-06-08 MED ORDER — INSULIN GLARGINE 100 UNIT/ML ~~LOC~~ SOLN: 10.0000 [IU] | Freq: Two times a day (BID) | SUBCUTANEOUS | Status: DC

## 2024-06-08 MED ORDER — LEVONORGESTREL 20 MCG/DAY IU IUD: 1.0000 | INTRAUTERINE_SYSTEM | Freq: Once | INTRAUTERINE | Status: DC

## 2024-06-08 MED ORDER — ACETAMINOPHEN 500 MG PO TABS
1000.0000 mg | ORAL_TABLET | Freq: Four times a day (QID) | ORAL | Status: DC
  Administered 2024-06-08 – 2024-06-10 (×9): 1000 mg via ORAL
  Filled 2024-06-08 (×10): qty 2

## 2024-06-08 MED ORDER — DEXAMETHASONE SOD PHOSPHATE PF 10 MG/ML IJ SOLN
INTRAMUSCULAR | Status: DC | PRN
  Administered 2024-06-08: 10 mg via INTRAVENOUS

## 2024-06-08 MED ORDER — MORPHINE SULFATE (PF) 0.5 MG/ML IJ SOLN
INTRAMUSCULAR | Status: AC
  Filled 2024-06-08: qty 10

## 2024-06-08 MED ORDER — TRANEXAMIC ACID-NACL 1000-0.7 MG/100ML-% IV SOLN
INTRAVENOUS | Status: DC | PRN
  Administered 2024-06-08: 1000 mg via INTRAVENOUS

## 2024-06-08 MED ORDER — OXYCODONE-ACETAMINOPHEN 5-325 MG PO TABS: 1.0000 | ORAL_TABLET | ORAL | Status: DC | PRN

## 2024-06-08 MED ORDER — MEASLES, MUMPS & RUBELLA VAC ~~LOC~~ SUSR
0.5000 mL | Freq: Once | SUBCUTANEOUS | Status: DC
  Filled 2024-06-08: qty 0.5

## 2024-06-08 MED ORDER — DIPHENHYDRAMINE HCL 25 MG PO CAPS: 25.0000 mg | ORAL_CAPSULE | Freq: Four times a day (QID) | ORAL | Status: DC | PRN

## 2024-06-08 MED ORDER — METFORMIN HCL 500 MG PO TABS
500.0000 mg | ORAL_TABLET | Freq: Two times a day (BID) | ORAL | Status: DC
  Administered 2024-06-09 – 2024-06-10 (×3): 500 mg via ORAL
  Filled 2024-06-08 (×4): qty 1

## 2024-06-08 MED ORDER — IBUPROFEN 600 MG PO TABS
600.0000 mg | ORAL_TABLET | Freq: Four times a day (QID) | ORAL | Status: DC
  Administered 2024-06-09 – 2024-06-10 (×5): 600 mg via ORAL
  Filled 2024-06-08 (×6): qty 1

## 2024-06-08 MED ORDER — PHENYLEPHRINE HCL-NACL 20-0.9 MG/250ML-% IV SOLN
INTRAVENOUS | Status: AC
  Filled 2024-06-08: qty 250

## 2024-06-08 MED ORDER — DEXAMETHASONE SOD PHOSPHATE PF 10 MG/ML IJ SOLN
INTRAMUSCULAR | Status: AC
  Filled 2024-06-08: qty 1

## 2024-06-08 MED ORDER — OXYTOCIN-SODIUM CHLORIDE 30-0.9 UT/500ML-% IV SOLN
INTRAVENOUS | Status: AC
  Filled 2024-06-08: qty 500

## 2024-06-08 MED ORDER — LACTATED RINGERS IV SOLN: INTRAVENOUS | Status: DC

## 2024-06-08 MED ORDER — OXYTOCIN-SODIUM CHLORIDE 30-0.9 UT/500ML-% IV SOLN
INTRAVENOUS | Status: DC | PRN
  Administered 2024-06-08: 30 [IU] via INTRAVENOUS

## 2024-06-08 MED ORDER — SENNOSIDES-DOCUSATE SODIUM 8.6-50 MG PO TABS
2.0000 | ORAL_TABLET | Freq: Every day | ORAL | Status: DC
  Administered 2024-06-09: 2 via ORAL
  Filled 2024-06-08 (×2): qty 2

## 2024-06-08 MED ORDER — ACETAMINOPHEN 500 MG PO TABS: 1000.0000 mg | ORAL_TABLET | Freq: Four times a day (QID) | ORAL | Status: DC

## 2024-06-08 MED ORDER — SOD CITRATE-CITRIC ACID 500-334 MG/5ML PO SOLN
30.0000 mL | ORAL | Status: DC | PRN
  Administered 2024-06-08: 30 mL via ORAL
  Filled 2024-06-08: qty 30

## 2024-06-08 MED ORDER — CEFAZOLIN SODIUM-DEXTROSE 2-3 GM-%(50ML) IV SOLR: INTRAVENOUS | Status: DC | PRN

## 2024-06-08 MED ORDER — COCONUT OIL OIL: 1.0000 | TOPICAL_OIL | Status: DC | PRN

## 2024-06-08 MED ORDER — KETOROLAC TROMETHAMINE 30 MG/ML IJ SOLN: 30.0000 mg | Freq: Four times a day (QID) | INTRAMUSCULAR | Status: AC | PRN

## 2024-06-08 MED ORDER — WITCH HAZEL-GLYCERIN EX PADS: 1.0000 | MEDICATED_PAD | CUTANEOUS | Status: DC | PRN

## 2024-06-08 MED ORDER — BUPIVACAINE IN DEXTROSE 0.75-8.25 % IT SOLN
INTRATHECAL | Status: DC | PRN
  Administered 2024-06-08: 1.6 mL via INTRATHECAL

## 2024-06-08 MED ORDER — STERILE WATER FOR IRRIGATION IR SOLN
Status: DC | PRN
  Administered 2024-06-08: 1

## 2024-06-08 MED ORDER — KETOROLAC TROMETHAMINE 30 MG/ML IJ SOLN
INTRAMUSCULAR | Status: AC
  Filled 2024-06-08: qty 1

## 2024-06-08 MED ORDER — SODIUM CHLORIDE 0.9 % IR SOLN
Status: DC | PRN
  Administered 2024-06-08: 1

## 2024-06-08 MED ORDER — FENTANYL CITRATE (PF) 100 MCG/2ML IJ SOLN
INTRAMUSCULAR | Status: AC
  Filled 2024-06-08: qty 2

## 2024-06-08 MED ORDER — ACETAMINOPHEN 10 MG/ML IV SOLN
INTRAVENOUS | Status: DC | PRN
  Administered 2024-06-08: 1000 mg via INTRAVENOUS

## 2024-06-08 MED ORDER — ONDANSETRON HCL 4 MG/2ML IJ SOLN
4.0000 mg | Freq: Four times a day (QID) | INTRAMUSCULAR | Status: DC | PRN
  Administered 2024-06-08: 4 mg via INTRAVENOUS

## 2024-06-08 MED ORDER — OXYCODONE HCL 5 MG PO TABS
5.0000 mg | ORAL_TABLET | ORAL | Status: DC | PRN
  Administered 2024-06-08: 10 mg via ORAL
  Filled 2024-06-08: qty 2

## 2024-06-08 MED ORDER — SODIUM CHLORIDE 0.9% FLUSH: 3.0000 mL | INTRAVENOUS | Status: DC | PRN

## 2024-06-08 MED ORDER — NALOXONE HCL 0.4 MG/ML IJ SOLN: 0.4000 mg | INTRAMUSCULAR | Status: DC | PRN

## 2024-06-08 MED ORDER — DIPHENHYDRAMINE HCL 50 MG/ML IJ SOLN: 12.5000 mg | Freq: Four times a day (QID) | INTRAMUSCULAR | Status: DC | PRN

## 2024-06-08 MED ORDER — SIMETHICONE 80 MG PO CHEW: 80.0000 mg | CHEWABLE_TABLET | ORAL | Status: DC | PRN

## 2024-06-08 MED ORDER — OXYCODONE-ACETAMINOPHEN 5-325 MG PO TABS: 2.0000 | ORAL_TABLET | ORAL | Status: DC | PRN

## 2024-06-08 MED ORDER — MORPHINE SULFATE (PF) 0.5 MG/ML IJ SOLN
INTRAMUSCULAR | Status: DC | PRN
  Administered 2024-06-08: 150 ug via INTRATHECAL

## 2024-06-08 MED ORDER — HYDROMORPHONE HCL 1 MG/ML IJ SOLN: 0.2000 mg | INTRAMUSCULAR | Status: DC | PRN

## 2024-06-08 MED ORDER — TRANEXAMIC ACID-NACL 1000-0.7 MG/100ML-% IV SOLN
INTRAVENOUS | Status: AC
  Filled 2024-06-08: qty 100

## 2024-06-08 MED ORDER — OXYTOCIN-SODIUM CHLORIDE 30-0.9 UT/500ML-% IV SOLN: 1.0000 m[IU]/min | INTRAVENOUS | Status: DC

## 2024-06-08 MED ORDER — POTASSIUM CHLORIDE CRYS ER 20 MEQ PO TBCR
20.0000 meq | EXTENDED_RELEASE_TABLET | Freq: Every day | ORAL | Status: DC
  Administered 2024-06-09 – 2024-06-10 (×2): 20 meq via ORAL
  Filled 2024-06-08 (×2): qty 1

## 2024-06-08 MED ORDER — KETOROLAC TROMETHAMINE 30 MG/ML IJ SOLN
30.0000 mg | Freq: Once | INTRAMUSCULAR | Status: AC | PRN
  Administered 2024-06-08: 30 mg via INTRAVENOUS

## 2024-06-08 MED ORDER — OXYTOCIN BOLUS FROM INFUSION: 333.0000 mL | Freq: Once | INTRAVENOUS | Status: DC

## 2024-06-08 MED ORDER — ONDANSETRON HCL 4 MG/2ML IJ SOLN
INTRAMUSCULAR | Status: AC
  Filled 2024-06-08: qty 2

## 2024-06-08 MED ORDER — FENTANYL CITRATE (PF) 100 MCG/2ML IJ SOLN
INTRAMUSCULAR | Status: DC | PRN
  Administered 2024-06-08: 15 ug via INTRATHECAL

## 2024-06-08 MED ORDER — ZOLPIDEM TARTRATE 5 MG PO TABS: 5.0000 mg | ORAL_TABLET | Freq: Every evening | ORAL | Status: DC | PRN

## 2024-06-08 MED ORDER — MENTHOL 3 MG MT LOZG: 1.0000 | LOZENGE | OROMUCOSAL | Status: DC | PRN

## 2024-06-08 MED ORDER — PROMETHAZINE (PHENERGAN) 6.25MG IN NS 50ML IVPB: 6.2500 mg | INTRAVENOUS | Status: DC | PRN

## 2024-06-08 MED ORDER — DIBUCAINE (PERIANAL) 1 % EX OINT: 1.0000 | TOPICAL_OINTMENT | CUTANEOUS | Status: DC | PRN

## 2024-06-08 MED ORDER — LACTATED RINGERS IV SOLN: 500.0000 mL | INTRAVENOUS | Status: DC | PRN

## 2024-06-08 MED ORDER — INSULIN GLARGINE 100 UNIT/ML ~~LOC~~ SOLN
10.0000 [IU] | Freq: Two times a day (BID) | SUBCUTANEOUS | Status: DC
  Administered 2024-06-08 – 2024-06-10 (×3): 10 [IU] via SUBCUTANEOUS
  Filled 2024-06-08 (×5): qty 0.1

## 2024-06-08 MED ORDER — DEXTROSE 5 % IV SOLN
INTRAVENOUS | Status: DC | PRN
  Administered 2024-06-08: 3 g via INTRAVENOUS

## 2024-06-08 MED ORDER — KETOROLAC TROMETHAMINE 30 MG/ML IJ SOLN
30.0000 mg | Freq: Four times a day (QID) | INTRAMUSCULAR | Status: AC
  Administered 2024-06-08 – 2024-06-09 (×4): 30 mg via INTRAVENOUS
  Filled 2024-06-08 (×4): qty 1

## 2024-06-08 MED ORDER — TERBUTALINE SULFATE 1 MG/ML IJ SOLN: 0.2500 mg | Freq: Once | INTRAMUSCULAR | Status: DC | PRN

## 2024-06-08 MED ORDER — INSULIN ASPART 100 UNIT/ML IJ SOLN
0.0000 [IU] | Freq: Three times a day (TID) | INTRAMUSCULAR | Status: DC
  Administered 2024-06-08 (×2): 3 [IU] via SUBCUTANEOUS
  Filled 2024-06-08 (×2): qty 3

## 2024-06-08 MED ORDER — SIMETHICONE 80 MG PO CHEW
80.0000 mg | CHEWABLE_TABLET | Freq: Three times a day (TID) | ORAL | Status: DC
  Administered 2024-06-08 – 2024-06-10 (×7): 80 mg via ORAL
  Filled 2024-06-08 (×7): qty 1

## 2024-06-08 MED ORDER — LIDOCAINE HCL (PF) 1 % IJ SOLN: 30.0000 mL | INTRAMUSCULAR | Status: DC | PRN

## 2024-06-08 MED ORDER — ONDANSETRON HCL 4 MG/2ML IJ SOLN: 4.0000 mg | Freq: Three times a day (TID) | INTRAMUSCULAR | Status: DC | PRN

## 2024-06-08 MED ORDER — OXYTOCIN-SODIUM CHLORIDE 30-0.9 UT/500ML-% IV SOLN
2.5000 [IU]/h | INTRAVENOUS | Status: DC
  Administered 2024-06-08: 2.5 [IU]/h via INTRAVENOUS

## 2024-06-08 MED ORDER — DEXMEDETOMIDINE HCL IN NACL 80 MCG/20ML IV SOLN
INTRAVENOUS | Status: DC | PRN
  Administered 2024-06-08 (×2): 8 ug via INTRAVENOUS

## 2024-06-08 MED ORDER — PRENATAL MULTIVITAMIN CH
1.0000 | ORAL_TABLET | Freq: Every day | ORAL | Status: DC
  Administered 2024-06-08 – 2024-06-10 (×3): 1 via ORAL
  Filled 2024-06-08 (×3): qty 1

## 2024-06-08 MED ORDER — ACETAMINOPHEN 325 MG PO TABS: 650.0000 mg | ORAL_TABLET | ORAL | Status: DC | PRN

## 2024-06-08 MED ORDER — ACETAMINOPHEN 10 MG/ML IV SOLN
INTRAVENOUS | Status: AC
  Filled 2024-06-08: qty 100

## 2024-06-08 MED ORDER — OXYTOCIN-SODIUM CHLORIDE 30-0.9 UT/500ML-% IV SOLN: 2.5000 [IU]/h | INTRAVENOUS | Status: AC

## 2024-06-08 MED ORDER — FENTANYL CITRATE (PF) 100 MCG/2ML IJ SOLN
25.0000 ug | INTRAMUSCULAR | Status: DC | PRN
  Administered 2024-06-08: 25 ug via INTRAVENOUS

## 2024-06-08 NOTE — Plan of Care (Signed)
 " Problem: Education: Goal: Knowledge of General Education information will improve Description: Including pain rating scale, medication(s)/side effects and non-pharmacologic comfort measures Outcome: Progressing   Problem: Health Behavior/Discharge Planning: Goal: Ability to manage health-related needs will improve Outcome: Progressing   Problem: Clinical Measurements: Goal: Ability to maintain clinical measurements within normal limits will improve Outcome: Progressing Goal: Will remain free from infection Outcome: Progressing Goal: Diagnostic test results will improve Outcome: Progressing Goal: Respiratory complications will improve Outcome: Progressing Goal: Cardiovascular complication will be avoided Outcome: Progressing   Problem: Activity: Goal: Risk for activity intolerance will decrease Outcome: Progressing   Problem: Nutrition: Goal: Adequate nutrition will be maintained Outcome: Progressing   Problem: Coping: Goal: Level of anxiety will decrease Outcome: Progressing   Problem: Elimination: Goal: Will not experience complications related to bowel motility Outcome: Progressing Goal: Will not experience complications related to urinary retention Outcome: Progressing   Problem: Pain Managment: Goal: General experience of comfort will improve and/or be controlled Outcome: Progressing   Problem: Safety: Goal: Ability to remain free from injury will improve Outcome: Progressing   Problem: Skin Integrity: Goal: Risk for impaired skin integrity will decrease Outcome: Progressing   Problem: Education: Goal: Knowledge of Childbirth will improve Outcome: Progressing Goal: Ability to make informed decisions regarding treatment and plan of care will improve Outcome: Progressing Goal: Ability to state and carry out methods to decrease the pain will improve Outcome: Progressing Goal: Individualized Educational Video(s) Outcome: Progressing   Problem:  Coping: Goal: Ability to verbalize concerns and feelings about labor and delivery will improve Outcome: Progressing   Problem: Life Cycle: Goal: Ability to make normal progression through stages of labor will improve Outcome: Progressing Goal: Ability to effectively push during vaginal delivery will improve Outcome: Progressing   Problem: Role Relationship: Goal: Will demonstrate positive interactions with the child Outcome: Progressing   Problem: Safety: Goal: Risk of complications during labor and delivery will decrease Outcome: Progressing   Problem: Pain Management: Goal: Relief or control of pain from uterine contractions will improve Outcome: Progressing   Problem: Education: Goal: Knowledge of the prescribed therapeutic regimen will improve Outcome: Progressing   Problem: Bowel/Gastric: Goal: Gastrointestinal status for postoperative course will improve Outcome: Progressing   Problem: Cardiac: Goal: Ability to maintain an adequate cardiac output Outcome: Progressing Goal: Will show no evidence of cardiac arrhythmias Outcome: Progressing   Problem: Nutritional: Goal: Will attain and maintain optimal nutritional status Outcome: Progressing   Problem: Neurological: Goal: Will regain or maintain usual level of consciousness Outcome: Progressing   Problem: Clinical Measurements: Goal: Ability to maintain clinical measurements within normal limits Outcome: Progressing Goal: Postoperative complications will be avoided or minimized Outcome: Progressing   Problem: Respiratory: Goal: Will regain and/or maintain adequate ventilation Outcome: Progressing Goal: Respiratory status will improve Outcome: Progressing   Problem: Skin Integrity: Goal: Demonstrates signs of wound healing without infection Outcome: Progressing   Problem: Urinary Elimination: Goal: Will remain free from infection Outcome: Progressing Goal: Ability to achieve and maintain adequate urine  output Outcome: Progressing   Problem: Education: Goal: Ability to describe self-care measures that may prevent or decrease complications (Diabetes Survival Skills Education) will improve Outcome: Progressing Goal: Individualized Educational Video(s) Outcome: Progressing   Problem: Coping: Goal: Ability to adjust to condition or change in health will improve Outcome: Progressing   Problem: Fluid Volume: Goal: Ability to maintain a balanced intake and output will improve Outcome: Progressing   Problem: Health Behavior/Discharge Planning: Goal: Ability to identify and utilize available resources and services will  improve Outcome: Progressing Goal: Ability to manage health-related needs will improve Outcome: Progressing   Problem: Metabolic: Goal: Ability to maintain appropriate glucose levels will improve Outcome: Progressing   "

## 2024-06-08 NOTE — Anesthesia Postprocedure Evaluation (Signed)
"   Anesthesia Post Note  Patient: Gina Lucas  Procedure(s) Performed: CESAREAN DELIVERY     Patient location during evaluation: PACU Anesthesia Type: Spinal Level of consciousness: awake Pain management: pain level controlled Vital Signs Assessment: post-procedure vital signs reviewed and stable Respiratory status: spontaneous breathing, respiratory function stable and nonlabored ventilation Cardiovascular status: blood pressure returned to baseline and stable Postop Assessment: no headache, no backache and no apparent nausea or vomiting Anesthetic complications: no   No notable events documented.  Last Vitals:  Vitals:   06/08/24 0701 06/08/24 0715  BP: (!) 154/80 139/80  Pulse: 66   Resp: 19   Temp:    SpO2: 95%     Last Pain:  Vitals:   06/08/24 0725  TempSrc:   PainSc: 4                  Delon Aisha Arch      "

## 2024-06-08 NOTE — Anesthesia Procedure Notes (Signed)
 Spinal  Patient location during procedure: OR Start time: 06/08/2024 4:04 AM End time: 06/08/2024 4:08 AM Reason for block: surgical anesthesia  Staffing Performed: anesthesiologist  Authorized by: Peggye Delon Brunswick, MD   Performed by: Peggye Delon Brunswick, MD  Preanesthetic Checklist Completed: patient identified, IV checked, site marked, risks and benefits discussed, surgical consent, monitors and equipment checked, pre-op evaluation and timeout performed Spinal Block Patient position: sitting Prep: DuraPrep Patient monitoring: blood pressure and continuous pulse ox Approach: midline Location: L3-4 Injection technique: single-shot Needle Needle type: Pencan  Needle gauge: 24 G Needle length: 9 cm Assessment Sensory level: T4  Additional Notes Risks and benefits of neuraxial anesthesia including, but not limited to, infection, bleeding, local anesthetic toxicity, headache, hypotension, back pain, block failure, etc. were discussed with the patient. The patient expressed understanding and consented to the procedure. I confirmed that the patient has no bleeding disorders and is not taking blood thinners. I confirmed the patient's last platelet count with the nurse. Monitors were applied. A time-out was performed immediately prior to the procedure. Sterile technique was used throughout the whole procedure.   1 attempt(s)

## 2024-06-08 NOTE — Op Note (Addendum)
 Gina Lucas PROCEDURE DATE: 06/08/2024  PREOPERATIVE DIAGNOSES: Intrauterine pregnancy at [redacted]w[redacted]d weeks gestation; LGA fetus with EFW of approximately 4500g & T2DM  POSTOPERATIVE DIAGNOSES: The same, macrosomia  PROCEDURE: Primary Low Transverse Cesarean Section  SURGEON:  Dr. Kieth Carolin  ASSISTANT:  Dr. Barkley Angles  ANESTHESIOLOGY TEAM: Anesthesiologist: Peggye Delon Brunswick, MD CRNA: Gregorio Adrien SAUNDERS, CRNA  INDICATIONS: Gina Lucas is a 29 y.o. [phone] at [redacted]w[redacted]d here for cesarean section secondary to the indications listed under preoperative diagnoses; please see preoperative note for further details.  The risks of surgery were discussed with the patient including but were not limited to: bleeding which may require transfusion or reoperation; infection which may require antibiotics; injury to bowel, bladder, ureters or other surrounding organs; injury to the fetus; need for additional procedures including hysterectomy in the event of a life-threatening hemorrhage; formation of adhesions; placental abnormalities wth subsequent pregnancies; incisional problems; thromboembolic phenomenon and other postoperative/anesthesia complications.  The patient concurred with the proposed plan, giving informed written consent for the procedure.    FINDINGS:  Viable female infant in cephalic presentation. Infant weight 4570g. Apgars 8 and 9.  Amniotic fluid: clear.  Intact placenta, three vessel cord.  Normal uterus, fallopian tubes and ovaries bilaterally.  ANESTHESIA: spinal INTRAVENOUS FLUIDS: 1000 ml   ESTIMATED BLOOD LOSS: 352 ml URINE OUTPUT:  50 ml SPECIMENS: Placenta sent to L&D  COMPLICATIONS: None immediate  PROCEDURE IN DETAIL:  The patient preoperatively received intravenous antibiotics and had sequential compression devices applied to her lower extremities.  She was then taken to the operating room where spinal anesthesia was found to be adequate. She was then placed in a  dorsal supine position with a leftward tilt, and prepped and draped in a sterile manner.  A foley catheter was  placed into her bladder and attached to constant gravity.  After an adequate timeout was performed, a Pfannenstiel skin incision was made with scalpel and carried through to the underlying layer of fascia. The fascia was incised in the midline, and this incision was extended bluntly. The rectus muscles were separated in the midline and the peritoneum was entered bluntly.   The Alexis self-retaining retractor was introduced into the abdominal cavity.  Attention was turned to the lower uterine segment where a low transverse hysterotomy was made with a scalpel and extended bilaterally bluntly.  The infant was successfully delivered, the cord was clamped and cut after one minute, and the infant was handed over to the awaiting neonatology team. The Thersia was removed. Uterine massage was then administered, and the placenta delivered intact with a three-vessel cord. The uterus was then exteriorized and cleared of clots and debris.  The hysterotomy was closed with 0 Monocryl in a running fashion. There was a small extension of the hysterotomy inferiorly on the left side. This was closed with 0-monocryl in a running fashion. Several figure-of-eight 0 Vicryl serosal stitches were placed to help with hemostasis.    The pelvis was cleared of all clot and debris. The uterus was returned to the abdomen. Hemostasis was confirmed on all surfaces. Surgicell powder was placed over the hysterotomy. The peritoneum was closed with a 2-0 Vicryl running stitch. The fascia was then closed using 0 PDS in a running fashion.  The subcutaneous layer was irrigated, and was found to be hemostatic, any areas of bleeding were cauterized with the bovie, and was reapproximated with two layers of 2-0 plain gut in a running fashion. The skin was closed with a 4-0  vicryl subcuticular stitch. The patient tolerated the procedure well.  Sponge, instrument and needle counts were correct x 3.  She was taken to the recovery room in stable condition.   Kieth Carolin, MD Obstetrician & Gynecologist, [facility] for [employer], [facility]

## 2024-06-08 NOTE — Inpatient Diabetes Management (Addendum)
 Inpatient Diabetes Program Recommendations  AACE/ADA: New Consensus Statement on Inpatient Glycemic Control (2015)  Target Ranges:  Prepandial:   less than 140 mg/dL      Peak postprandial:   less than 180 mg/dL (1-2 hours)      Critically ill patients:  140 - 180 mg/dL   Lab Results  Component Value Date   GLUCAP 167 (H) 06/08/2024   HGBA1C 6.8 (H) 12/24/2023    Latest Reference Range & Units 04/03/22 11:11  Hemoglobin A1C 4.8 - 5.6 % 6.0 (H)  (H): Data is abnormally high  Review of Glycemic Control  Latest Reference Range & Units 06/08/24 03:35 06/08/24 06:28  Glucose-Capillary 70 - 99 mg/dL 877 (H) 832 (H)   Diabetes history: GDM vs. Type 2  (A1C at 13 weeks was 6.8%) Outpatient Diabetes medications:  During pregnancy patient was on Lantus  42 units bid/Metformin  500 mg bid Current orders for Inpatient glycemic control:  Novolog  0-9 units tid with meals Metformin  500 mg bid  Inpatient Diabetes Program Recommendations:    Note patient received Decadron  last night which likely increased CBG's.  Should not need insulin  at discharge.  Discussed with MD.   Thanks,  Randall Bullocks, RN, BC-ADM Inpatient Diabetes Coordinator Pager [phone]  (8a-5p)

## 2024-06-08 NOTE — Discharge Summary (Signed)
 "    Postpartum Discharge Summary  Date of Service updated***     Patient Name: Gina Lucas DOB: Dec 20, 1995 MRN: [id number]  Date of admission: 06/08/2024 Delivery date:06/08/2024 Delivering provider: ERIK KIETH BROCKS Date of discharge: 06/08/2024  Admitting diagnosis: Encounter for induction of labor [Z34.90] Intrauterine pregnancy: [redacted]w[redacted]d     Secondary diagnosis:  Active Problems:   Type 2 diabetes mellitus affecting pregnancy, antepartum   S/P cesarean section   Gestational hypertension   Macrosomia  Additional problems: None    Discharge diagnosis: Term Pregnancy Delivered, Gestational Hypertension, and Type 2 DM                                              Post partum procedures:{Postpartum procedures:23558} Augmentation: N/A Complications: None  Hospital course: Sceduled C/S   29 y.o. yo [id number] at [redacted]w[redacted]d was admitted to the hospital 06/08/2024 initially for induction of labor. A primary cesarean delivery was recommended for EFW 4500g and T2DM. She underwent an uncomplicated primary low transverse CS Delivery details are as follows:  Membrane Rupture Time/Date: 4:32 AM,06/08/2024  Delivery Method:C-Section, Low Transverse Operative Delivery:N/A Details of operation can be found in separate operative note.  Patient had a postpartum course complicated by T2DM - she was transitioned to metformin  500mg  BID and *** in alignment with DM coordinator recommendations gHTN - she received lasix /K x 5d PP. She did not*** require antihypertensive medications for BP control.   She is ambulating, tolerating a regular diet, passing flatus, and urinating well. Patient is discharged home in stable condition on  06/08/24        Newborn Data: Birth date:06/08/2024 Birth time:4:33 AM Gender:Female Living status:Living Apgars:8 ,9  Weight:4570 g    Magnesium  Sulfate received: {Mag received:30440022} BMZ received: No Rhophylac:N/A MMR:{MMR:30440033}-offered T-DaP:Given prenatally Flu:  No RSV Vaccine received: No Transfusion:No***  Immunizations received: Immunization History  Administered Date(s) Administered   Tdap 11/01/2020, 04/27/2024    Physical exam  Vitals:   06/08/24 0207 06/08/24 0208  BP: (!) 145/76 (!) 145/76  Pulse: 91 72  Resp:  18  Temp:  98.4 F (36.9 C)  TempSrc:  Oral  SpO2: 99% 100%  Weight:  (!) 152.1 kg  Height:  5' 6 (1.676 m)   General: {Exam; general:21111117} Lochia: {Desc; appropriate/inappropriate:30686::appropriate} Uterine Fundus: {Desc; firm/soft:30687} Incision: {Exam; incision:21111123} DVT Evaluation: {Exam; icu:7888877} Labs: Lab Results  Component Value Date   WBC 8.5 06/08/2024   HGB 10.2 (L) 06/08/2024   HCT 31.3 (L) 06/08/2024   MCV 79.8 (L) 06/08/2024   PLT 173 06/08/2024      Latest Ref Rng & Units 06/04/2024    4:09 PM  CMP  Glucose 70 - 99 mg/dL 813   BUN 6 - 20 mg/dL 9   Creatinine 9.55 - 8.99 mg/dL 9.45   Sodium 864 - 854 mmol/L 133   Potassium 3.5 - 5.1 mmol/L 3.9   Chloride 98 - 111 mmol/L 103   CO2 22 - 32 mmol/L 18   Calcium 8.9 - 10.3 mg/dL 9.0   Total Protein 6.5 - 8.1 g/dL 5.8   Total Bilirubin 0.0 - 1.2 mg/dL 0.4   Alkaline Phos 38 - 126 U/L 121   AST 15 - 41 U/L 14   ALT 0 - 44 U/L 9    Edinburgh Score:    02/15/2021   10:32 AM  [facility] Postnatal Depression  Scale Screening Tool  I have been able to laugh and see the funny side of things. 0   I have looked forward with enjoyment to things. 0   I have blamed myself unnecessarily when things went wrong. 0   I have been anxious or worried for no good reason. 0   I have felt scared or panicky for no good reason. 0   Things have been getting on top of me. 0   I have been so unhappy that I have had difficulty sleeping. 0   I have felt sad or miserable. 0   I have been so unhappy that I have been crying. 0   The thought of harming myself has occurred to me. 0   Edinburgh Postnatal Depression Scale Total 0      Data saved with a  previous flowsheet row definition   No data recorded  After visit meds:  Allergies as of 06/08/2024       Reactions   Other Itching   Blue Cheese   Vinegar [acetic Acid] Hives     Med Rec must be completed prior to using this [facility]***        Discharge home in stable condition Infant Feeding: Breast Infant Disposition:{CHL IP OB HOME WITH FNUYZM:76418} Discharge instruction: per After Visit Summary and Postpartum booklet. Activity: Advance as tolerated. Pelvic rest for 6 weeks.  Diet: routine diet Future Appointments: Future Appointments  Date Time Provider Department Center  06/08/2024  7:15 AM [facility] SCHED ROOM [facility] None  06/16/2024  8:15 AM [facility] NURSE [facility] None  07/22/2024 10:15 AM Barbra Lang PARAS, DO [facility] None   Follow up Visit:  Message sent by [facility] 1/20 AM Please schedule this patient for a In person postpartum visit in 6 weeks with the following provider: Any provider. Additional Postpartum F/U:BP & incision check in 1 week. 2h GTT. High risk pregnancy complicated by: T2DM vs gDM/LGA/poly, gHTN, BMI 54 Delivery mode:  C-Section, Low Transverse Anticipated Birth Control:  IUD - Mirena  to be placed outpatient   06/08/2024 Kieth JAYSON Carolin, MD    "

## 2024-06-08 NOTE — Transfer of Care (Signed)
 Immediate Anesthesia Transfer of Care Note  Patient: Gina Lucas  Procedure(s) Performed: CESAREAN DELIVERY  Patient Location: PACU  Anesthesia Type:Spinal  Level of Consciousness: awake, alert , oriented, and patient cooperative  Airway & Oxygen Therapy: Patient Spontanous Breathing  Post-op Assessment: Report given to RN and Post -op Vital signs reviewed and stable  Post vital signs: Reviewed and stable  Last Vitals:  Vitals Value Taken Time  BP 149/86 06/08/24 05:55  Temp    Pulse 153 06/08/24 05:59  Resp 24 06/08/24 05:59  SpO2 93 % 06/08/24 05:59  Vitals shown include unfiled device data.  Last Pain:  Vitals:   06/08/24 0308  TempSrc:   PainSc: 0-No pain      Patients Stated Pain Goal: 0 (06/08/24 0308)  Complications: No notable events documented.

## 2024-06-08 NOTE — Anesthesia Preprocedure Evaluation (Signed)
 "                                  Anesthesia Evaluation  Patient identified by MRN, date of birth, ID band Patient awake    Reviewed: Allergy & Precautions, NPO status , Patient's Chart, lab work & pertinent test results  History of Anesthesia Complications Negative for: history of anesthetic complications  Airway Mallampati: I  TM Distance: >3 FB Neck ROM: Full    Dental   Pulmonary neg shortness of breath, neg sleep apnea, neg COPD, neg recent URI, former smoker   Pulmonary exam normal breath sounds clear to auscultation       Cardiovascular hypertension (gestational),  Rhythm:Regular Rate:Normal     Neuro/Psych  PSYCHIATRIC DISORDERS  Depression    negative neurological ROS     GI/Hepatic negative GI ROS, Neg liver ROS,,,  Endo/Other  diabetes, Type 2, Insulin  Dependent, Oral Hypoglycemic Agents  Class 4 obesity  Renal/GU negative Renal ROS     Musculoskeletal   Abdominal  (+) + obese  Peds  Hematology  (+) Blood dyscrasia, anemia Lab Results      Component                Value               Date                      WBC                      8.5                 06/08/2024                HGB                      10.2 (L)            06/08/2024                HCT                      31.3 (L)            06/08/2024                MCV                      79.8 (L)            06/08/2024                PLT                      173                 06/08/2024              Anesthesia Other Findings   Reproductive/Obstetrics (+) Pregnancy                              Anesthesia Physical Anesthesia Plan  ASA: 3  Anesthesia Plan: Spinal   Post-op Pain Management:    Induction: Intravenous  PONV Risk Score and Plan: 2 and Ondansetron , Dexamethasone  and Treatment may vary due to age or medical condition  Airway Management Planned: Natural Airway  Additional Equipment:   Intra-op Plan:   Post-operative Plan:    Informed Consent: I have reviewed the patients History and Physical, chart, labs and discussed the procedure including the risks, benefits and alternatives for the proposed anesthesia with the patient or authorized representative who has indicated his/her understanding and acceptance.     Dental advisory given  Plan Discussed with: CRNA and Anesthesiologist  Anesthesia Plan Comments: (I have discussed risks of neuraxial anesthesia including but not limited to infection, bleeding, nerve injury, back pain, headache, seizures, and failure of block. Patient denies bleeding disorders and is not currently anticoagulated. Labs have been reviewed. Risks and benefits discussed. All patient's questions answered.  )        Anesthesia Quick Evaluation  "

## 2024-06-09 ENCOUNTER — Encounter (HOSPITAL_COMMUNITY): Payer: Self-pay | Admitting: Family Medicine

## 2024-06-09 LAB — COMPREHENSIVE METABOLIC PANEL WITH GFR
ALT: 8 U/L (ref 0–44)
AST: 24 U/L (ref 15–41)
Albumin: 2.9 g/dL — ABNORMAL LOW (ref 3.5–5.0)
Alkaline Phosphatase: 102 U/L (ref 38–126)
Anion gap: 9 (ref 5–15)
BUN: 16 mg/dL (ref 6–20)
CO2: 22 mmol/L (ref 22–32)
Calcium: 8.3 mg/dL — ABNORMAL LOW (ref 8.9–10.3)
Chloride: 101 mmol/L (ref 98–111)
Creatinine, Ser: 0.55 mg/dL (ref 0.44–1.00)
GFR, Estimated: >60 mL/min
Glucose, Bld: 133 mg/dL — ABNORMAL HIGH (ref 70–99)
Potassium: 4.4 mmol/L (ref 3.5–5.1)
Sodium: 132 mmol/L — ABNORMAL LOW (ref 135–145)
Total Bilirubin: 0.5 mg/dL (ref 0.0–1.2)
Total Protein: 5.2 g/dL — ABNORMAL LOW (ref 6.5–8.1)

## 2024-06-09 LAB — CBC
HCT: 26.6 % — ABNORMAL LOW (ref 36.0–46.0)
Hemoglobin: 8.6 g/dL — ABNORMAL LOW (ref 12.0–15.0)
MCH: 25.9 pg — ABNORMAL LOW (ref 26.0–34.0)
MCHC: 32.3 g/dL (ref 30.0–36.0)
MCV: 80.1 fL (ref 80.0–100.0)
Platelets: 146 K/uL — ABNORMAL LOW (ref 150–400)
RBC: 3.32 MIL/uL — ABNORMAL LOW (ref 3.87–5.11)
RDW: 13.4 % (ref 11.5–15.5)
WBC: 11.5 K/uL — ABNORMAL HIGH (ref 4.0–10.5)
nRBC: 0 % (ref 0.0–0.2)

## 2024-06-09 MED ORDER — NIFEDIPINE ER OSMOTIC RELEASE 30 MG PO TB24
30.0000 mg | ORAL_TABLET | Freq: Every day | ORAL | Status: DC
  Administered 2024-06-09 – 2024-06-10 (×2): 30 mg via ORAL
  Filled 2024-06-09 (×2): qty 1

## 2024-06-09 MED ORDER — FERROUS SULFATE 325 (65 FE) MG PO TABS
325.0000 mg | ORAL_TABLET | ORAL | Status: DC
  Administered 2024-06-09: 325 mg via ORAL
  Filled 2024-06-09: qty 1

## 2024-06-09 NOTE — Social Work (Signed)
 MOB was referred for history of depression/anxiety.  * Referral screened out by Clinical Social Worker because none of the following criteria appear to apply:  ~ History of anxiety/depression during this pregnancy, or of post-partum depression following prior delivery.  ~ Diagnosis of anxiety and/or depression within last 3 years OR * MOB's symptoms currently being treated with medication and/or therapy. Per chart review MOB is seeing a therapist, Edinburgh=1  Please contact the Clinical Social Worker if needs arise, or by MOB request.   Eliazar Gave, LCSWA Clinical Social Worker [phone]

## 2024-06-09 NOTE — Progress Notes (Signed)
 POSTPARTUM PROGRESS NOTE  POD #1  Subjective:  Gina Lucas is a 29 y.o. [id number] s/p pLTCS at [redacted]w[redacted]d.  No acute events overnight. She reports she is doing well. She denies any problems with ambulating, voiding or po intake. Denies nausea or vomiting. She has passed flatus. Pain is well controlled.  Lochia is minimal.  Objective: BP 127/82 (BP Location: Left Arm)   Pulse 70   Temp 98.4 F (36.9 C) (Oral)   Resp 18   Ht 5' 6 (1.676 m)   Wt (!) 152.1 kg   LMP 09/23/2023   SpO2 99%   Breastfeeding Unknown   BMI 54.13 kg/m   Physical Exam:  General: alert, cooperative and no distress Chest: no respiratory distress Heart:regular rate, distal pulses intact Abdomen: soft, nontender,  Uterine Fundus: firm, appropriately tender DVT Evaluation: No calf swelling or tenderness Extremities: no LE edema Skin: warm, dry; incision clean/dry/intact w/ honeycomb dressing in place  Recent Labs    06/08/24 0238 06/09/24 0421  HGB 10.2* 8.6*  HCT 31.3* 26.6*    Assessment/Plan: Gina Lucas is a 29 y.o. [id number] s/p pLTCS at [redacted]w[redacted]d for fetal macrosomia in the setting of T2DM.  POD#1 - Doing welll; pain well controlled. H/H appropriate  Routine postpartum care  OOB, ambulated  Lovenox  for VTE prophylaxis Anemia: asymptomatic, start po ferrous sulfate  every other day Gestational HTN: intermittent mild range BP's, start Procardia  30 XL Type 2 DM: reports no meds prior to pregnancy, given A1c was 6.8% early in pregnancy recommended continuing metformin  500 BID, she is amenable Contraception: outpatient IUD, though still not totally decided Feeding: bottle  Dispo: Plan for discharge POD#2-3 pending clinical course.   LOS: 1 day   Donnice CHRISTELLA Carolus, MD/MPH Attending Family Medicine Physician, [facility] for [facility], [facility]  06/09/2024, 2:21 PM

## 2024-06-09 NOTE — Progress Notes (Signed)
" °   06/09/24 1200  Continuous Glucose Monitoring   Continuous Glucose Reading (mg/dL) Reference Range 29-00 mg/dL (!) 858 mg/dL  CGM Location  LUA   Pt declined insulin  (Lantus  and Novolog ) at this time. Pt stated she saw how her glucose rose after eating and is coming down like it should. Pt will call out if she wants her insulin . Diabetic coordinator's note reflects patient coming off insulin  when she discharges.  "

## 2024-06-10 ENCOUNTER — Other Ambulatory Visit (HOSPITAL_COMMUNITY): Payer: Self-pay

## 2024-06-10 ENCOUNTER — Ambulatory Visit

## 2024-06-10 MED ORDER — FERROUS SULFATE 325 (65 FE) MG PO TABS
325.0000 mg | ORAL_TABLET | ORAL | 3 refills | Status: AC
  Filled 2024-06-10: qty 30, 60d supply, fill #0

## 2024-06-10 MED ORDER — INSULIN GLARGINE 100 UNIT/ML SOLOSTAR PEN
10.0000 [IU] | PEN_INJECTOR | Freq: Two times a day (BID) | SUBCUTANEOUS | 12 refills | Status: AC
  Filled 2024-06-10: qty 6, 30d supply, fill #0

## 2024-06-10 MED ORDER — SENNOSIDES-DOCUSATE SODIUM 8.6-50 MG PO TABS
2.0000 | ORAL_TABLET | Freq: Every evening | ORAL | 0 refills | Status: AC | PRN
  Filled 2024-06-10: qty 30, 15d supply, fill #0

## 2024-06-10 MED ORDER — NIFEDIPINE ER 30 MG PO TB24
30.0000 mg | ORAL_TABLET | Freq: Two times a day (BID) | ORAL | 2 refills | Status: AC
  Filled 2024-06-10: qty 60, 30d supply, fill #0

## 2024-06-10 MED ORDER — ACETAMINOPHEN 500 MG PO TABS
1000.0000 mg | ORAL_TABLET | Freq: Four times a day (QID) | ORAL | 2 refills | Status: AC | PRN
  Filled 2024-06-10: qty 60, 8d supply, fill #0

## 2024-06-10 MED ORDER — METFORMIN HCL 500 MG PO TABS
500.0000 mg | ORAL_TABLET | Freq: Two times a day (BID) | ORAL | 1 refills | Status: AC
  Filled 2024-06-10: qty 60, 30d supply, fill #0

## 2024-06-10 MED ORDER — POTASSIUM CHLORIDE CRYS ER 20 MEQ PO TBCR
20.0000 meq | EXTENDED_RELEASE_TABLET | Freq: Every day | ORAL | 0 refills | Status: DC
  Filled 2024-06-10: qty 5, 5d supply, fill #0

## 2024-06-10 MED ORDER — OXYCODONE HCL 5 MG PO TABS
5.0000 mg | ORAL_TABLET | ORAL | 0 refills | Status: AC | PRN
  Filled 2024-06-10: qty 30, 5d supply, fill #0

## 2024-06-10 MED ORDER — ENOXAPARIN SODIUM 80 MG/0.8ML IJ SOSY
75.0000 mg | PREFILLED_SYRINGE | INTRAMUSCULAR | Status: DC
  Administered 2024-06-10: 75 mg via SUBCUTANEOUS
  Filled 2024-06-10: qty 0.8

## 2024-06-10 MED ORDER — IBUPROFEN 600 MG PO TABS
600.0000 mg | ORAL_TABLET | Freq: Four times a day (QID) | ORAL | 2 refills | Status: AC | PRN
  Filled 2024-06-10: qty 60, 15d supply, fill #0

## 2024-06-10 MED ORDER — FUROSEMIDE 20 MG PO TABS
20.0000 mg | ORAL_TABLET | Freq: Every day | ORAL | 0 refills | Status: DC
  Filled 2024-06-10: qty 5, 5d supply, fill #0

## 2024-06-10 NOTE — Plan of Care (Signed)
 " Problem: Clinical Measurements: Goal: Ability to maintain clinical measurements within normal limits will improve Outcome: Completed/Met Goal: Will remain free from infection Outcome: Completed/Met Goal: Diagnostic test results will improve Outcome: Completed/Met Goal: Respiratory complications will improve Outcome: Completed/Met Goal: Cardiovascular complication will be avoided Outcome: Completed/Met   Problem: Activity: Goal: Risk for activity intolerance will decrease Outcome: Completed/Met   Problem: Nutrition: Goal: Adequate nutrition will be maintained Outcome: Completed/Met   Problem: Coping: Goal: Level of anxiety will decrease Outcome: Completed/Met   Problem: Elimination: Goal: Will not experience complications related to bowel motility Outcome: Completed/Met Goal: Will not experience complications related to urinary retention Outcome: Completed/Met   Problem: Pain Managment: Goal: General experience of comfort will improve and/or be controlled Outcome: Completed/Met   Problem: Safety: Goal: Ability to remain free from injury will improve Outcome: Completed/Met   Problem: Skin Integrity: Goal: Risk for impaired skin integrity will decrease Outcome: Completed/Met   Problem: Education: Goal: Knowledge of Childbirth will improve Outcome: Completed/Met Goal: Ability to make informed decisions regarding treatment and plan of care will improve Outcome: Completed/Met Goal: Ability to state and carry out methods to decrease the pain will improve Outcome: Completed/Met Goal: Individualized Educational Video(s) Outcome: Completed/Met   Problem: Coping: Goal: Ability to verbalize concerns and feelings about labor and delivery will improve Outcome: Completed/Met   Problem: Life Cycle: Goal: Ability to make normal progression through stages of labor will improve Outcome: Completed/Met Goal: Ability to effectively push during vaginal delivery will  improve Outcome: Completed/Met   Problem: Role Relationship: Goal: Will demonstrate positive interactions with the child Outcome: Completed/Met   Problem: Safety: Goal: Risk of complications during labor and delivery will decrease Outcome: Completed/Met   Problem: Pain Management: Goal: Relief or control of pain from uterine contractions will improve Outcome: Completed/Met   Problem: Education: Goal: Knowledge of the prescribed therapeutic regimen will improve Outcome: Completed/Met   Problem: Bowel/Gastric: Goal: Gastrointestinal status for postoperative course will improve Outcome: Completed/Met   Problem: Cardiac: Goal: Ability to maintain an adequate cardiac output Outcome: Completed/Met Goal: Will show no evidence of cardiac arrhythmias Outcome: Completed/Met   Problem: Nutritional: Goal: Will attain and maintain optimal nutritional status Outcome: Completed/Met   Problem: Neurological: Goal: Will regain or maintain usual level of consciousness Outcome: Completed/Met   Problem: Clinical Measurements: Goal: Ability to maintain clinical measurements within normal limits Outcome: Completed/Met Goal: Postoperative complications will be avoided or minimized Outcome: Completed/Met   Problem: Respiratory: Goal: Will regain and/or maintain adequate ventilation Outcome: Completed/Met Goal: Respiratory status will improve Outcome: Completed/Met   Problem: Skin Integrity: Goal: Demonstrates signs of wound healing without infection Outcome: Completed/Met   Problem: Urinary Elimination: Goal: Will remain free from infection Outcome: Completed/Met Goal: Ability to achieve and maintain adequate urine output Outcome: Completed/Met   Problem: Education: Goal: Ability to describe self-care measures that may prevent or decrease complications (Diabetes Survival Skills Education) will improve Outcome: Completed/Met Goal: Individualized Educational Video(s) Outcome:  Completed/Met   Problem: Coping: Goal: Ability to adjust to condition or change in health will improve Outcome: Completed/Met   Problem: Fluid Volume: Goal: Ability to maintain a balanced intake and output will improve Outcome: Completed/Met   Problem: Health Behavior/Discharge Planning: Goal: Ability to identify and utilize available resources and services will improve Outcome: Completed/Met Goal: Ability to manage health-related needs will improve Outcome: Completed/Met   Problem: Metabolic: Goal: Ability to maintain appropriate glucose levels will improve Outcome: Completed/Met   Problem: Nutritional: Goal: Maintenance of adequate nutrition will improve Outcome: Completed/Met  Goal: Progress toward achieving an optimal weight will improve Outcome: Completed/Met   Problem: Skin Integrity: Goal: Risk for impaired skin integrity will decrease Outcome: Completed/Met   Problem: Tissue Perfusion: Goal: Adequacy of tissue perfusion will improve Outcome: Completed/Met   Problem: Education: Goal: Knowledge of the prescribed therapeutic regimen will improve Outcome: Completed/Met Goal: Understanding of sexual limitations or changes related to disease process or condition will improve Outcome: Completed/Met Goal: Individualized Educational Video(s) Outcome: Completed/Met   Problem: Self-Concept: Goal: Communication of feelings regarding changes in body function or appearance will improve Outcome: Completed/Met   Problem: Skin Integrity: Goal: Demonstration of wound healing without infection will improve Outcome: Completed/Met   Problem: Education: Goal: Knowledge of condition will improve Outcome: Completed/Met Goal: Individualized Educational Video(s) Outcome: Completed/Met Goal: Individualized Newborn Educational Video(s) Outcome: Completed/Met   Problem: Activity: Goal: Will verbalize the importance of balancing activity with adequate rest periods Outcome:  Completed/Met Goal: Ability to tolerate increased activity will improve Outcome: Completed/Met   Problem: Coping: Goal: Ability to identify and utilize available resources and services will improve Outcome: Completed/Met   Problem: Life Cycle: Goal: Chance of risk for complications during the postpartum period will decrease Outcome: Completed/Met   Problem: Role Relationship: Goal: Ability to demonstrate positive interaction with newborn will improve Outcome: Completed/Met   Problem: Skin Integrity: Goal: Demonstration of wound healing without infection will improve Outcome: Completed/Met   "

## 2024-06-10 NOTE — Patient Instructions (Signed)

## 2024-06-10 NOTE — Progress Notes (Signed)
 POSTPARTUM PROGRESS NOTE  POD #2  Subjective:  Gina Lucas is a 29 y.o. [id number] s/p pLTCS at [redacted]w[redacted]d.  No acute events overnight. She reports she is doing well. She denies any problems with ambulating, voiding, or po intake. Denies nausea or vomiting. She has had a BM. Pain is well controlled. Lochia is minimal.   Objective: BP 128/77 (BP Location: Left Arm)   Pulse 70   Temp 98.1 F (36.7 C) (Oral)   Resp 18   Ht 5' 6 (1.676 m)   Wt (!) 152.1 kg   LMP 09/23/2023   SpO2 98%   Breastfeeding Unknown   BMI 54.13 kg/m   Physical Exam:  General: alert, cooperative and no distress Chest: no respiratory distress Heart:regular rate, distal pulses intact Abdomen: soft, nontender,  Uterine Fundus: firm, appropriately tender DVT Evaluation: No calf swelling or tenderness Extremities: bilateral LE edema Skin: warm, dry; incision clean/dry/intact w/ honeycomb dressing in place  Recent Labs    06/08/24 0238 06/09/24 0421  HGB 10.2* 8.6*  HCT 31.3* 26.6*    Assessment/Plan: Gina Lucas is a 29 y.o. [id number] s/p pLTCS at [redacted]w[redacted]d for fetal macrosomia in the setting of T2DM.  POD#2 - Doing welll; pain well controlled. H/H appropriate  Routine postpartum care  OOB, ambulated Anemia: asymptomatic, continue po ferrous sulfate  every other day Gestational HTN: intermittent mild range BP's, increase Procardia  30 XL PO BID Type 2 DM: continue metformin  500 BID Contraception: Outpatient IUD  Feeding: bottle  Dispo: Plan for discharge later today   LOS: 2 days   Gina Lucas, Medical Student   Attestation of Attending Supervision of Student:  I confirm that I have verified the information documented in the medical students note and that I have also personally reperformed the history, physical exam and all medical decision making activities.  I have verified that all services and findings are accurately documented in this student's note; and I agree with management and plan as  outlined in the documentation. I have also made any necessary editorial changes.  Please refer to my separate discharge summary for more details about the patient's discharge to home today.   Gina HUGGER, MD, FACOG Attending Obstetrician & Gynecologist, [facility] for [employer], [facility]

## 2024-06-10 NOTE — Plan of Care (Signed)
 " Problem: Clinical Measurements: Goal: Ability to maintain clinical measurements within normal limits will improve Outcome: Completed/Met Goal: Will remain free from infection Outcome: Completed/Met Goal: Diagnostic test results will improve Outcome: Completed/Met Goal: Respiratory complications will improve Outcome: Completed/Met Goal: Cardiovascular complication will be avoided Outcome: Completed/Met   Problem: Activity: Goal: Risk for activity intolerance will decrease Outcome: Completed/Met   Problem: Nutrition: Goal: Adequate nutrition will be maintained Outcome: Completed/Met   Problem: Coping: Goal: Level of anxiety will decrease Outcome: Completed/Met   Problem: Elimination: Goal: Will not experience complications related to bowel motility Outcome: Completed/Met Goal: Will not experience complications related to urinary retention Outcome: Completed/Met   Problem: Pain Managment: Goal: General experience of comfort will improve and/or be controlled Outcome: Completed/Met   Problem: Safety: Goal: Ability to remain free from injury will improve Outcome: Completed/Met   Problem: Skin Integrity: Goal: Risk for impaired skin integrity will decrease Outcome: Completed/Met   Problem: Education: Goal: Knowledge of Childbirth will improve Outcome: Completed/Met Goal: Ability to make informed decisions regarding treatment and plan of care will improve Outcome: Completed/Met Goal: Ability to state and carry out methods to decrease the pain will improve Outcome: Completed/Met Goal: Individualized Educational Video(s) Outcome: Completed/Met   Problem: Coping: Goal: Ability to verbalize concerns and feelings about labor and delivery will improve Outcome: Completed/Met   Problem: Life Cycle: Goal: Ability to make normal progression through stages of labor will improve Outcome: Completed/Met Goal: Ability to effectively push during vaginal delivery will  improve Outcome: Completed/Met   Problem: Role Relationship: Goal: Will demonstrate positive interactions with the child Outcome: Completed/Met   Problem: Safety: Goal: Risk of complications during labor and delivery will decrease Outcome: Completed/Met   Problem: Pain Management: Goal: Relief or control of pain from uterine contractions will improve Outcome: Completed/Met   Problem: Education: Goal: Knowledge of the prescribed therapeutic regimen will improve Outcome: Completed/Met   Problem: Bowel/Gastric: Goal: Gastrointestinal status for postoperative course will improve Outcome: Completed/Met   Problem: Cardiac: Goal: Ability to maintain an adequate cardiac output Outcome: Completed/Met Goal: Will show no evidence of cardiac arrhythmias Outcome: Completed/Met   Problem: Nutritional: Goal: Will attain and maintain optimal nutritional status Outcome: Completed/Met   Problem: Neurological: Goal: Will regain or maintain usual level of consciousness Outcome: Completed/Met   Problem: Clinical Measurements: Goal: Ability to maintain clinical measurements within normal limits Outcome: Completed/Met Goal: Postoperative complications will be avoided or minimized Outcome: Completed/Met   Problem: Respiratory: Goal: Will regain and/or maintain adequate ventilation Outcome: Completed/Met Goal: Respiratory status will improve Outcome: Completed/Met   Problem: Skin Integrity: Goal: Demonstrates signs of wound healing without infection Outcome: Completed/Met   Problem: Urinary Elimination: Goal: Will remain free from infection Outcome: Completed/Met Goal: Ability to achieve and maintain adequate urine output Outcome: Completed/Met   Problem: Education: Goal: Ability to describe self-care measures that may prevent or decrease complications (Diabetes Survival Skills Education) will improve Outcome: Completed/Met Goal: Individualized Educational Video(s) Outcome:  Completed/Met   Problem: Coping: Goal: Ability to adjust to condition or change in health will improve Outcome: Completed/Met   Problem: Fluid Volume: Goal: Ability to maintain a balanced intake and output will improve Outcome: Completed/Met   Problem: Health Behavior/Discharge Planning: Goal: Ability to identify and utilize available resources and services will improve Outcome: Completed/Met Goal: Ability to manage health-related needs will improve Outcome: Completed/Met   Problem: Metabolic: Goal: Ability to maintain appropriate glucose levels will improve Outcome: Completed/Met   Problem: Nutritional: Goal: Maintenance of adequate nutrition will improve Outcome: Completed/Met  Goal: Progress toward achieving an optimal weight will improve Outcome: Completed/Met   Problem: Skin Integrity: Goal: Risk for impaired skin integrity will decrease Outcome: Completed/Met   "

## 2024-06-13 ENCOUNTER — Encounter: Payer: Self-pay | Admitting: Obstetrics and Gynecology

## 2024-06-16 ENCOUNTER — Ambulatory Visit

## 2024-06-16 VITALS — BP 138/89 | HR 81 | Ht 67.0 in | Wt 295.0 lb

## 2024-06-16 DIAGNOSIS — E1159 Type 2 diabetes mellitus with other circulatory complications: Secondary | ICD-10-CM

## 2024-06-16 DIAGNOSIS — Z4889 Encounter for other specified surgical aftercare: Secondary | ICD-10-CM | POA: Insufficient documentation

## 2024-06-16 DIAGNOSIS — Z0130 Encounter for examination of blood pressure without abnormal findings: Secondary | ICD-10-CM | POA: Diagnosis not present

## 2024-06-16 MED ORDER — POTASSIUM CHLORIDE CRYS ER 20 MEQ PO TBCR: 20.0000 meq | EXTENDED_RELEASE_TABLET | Freq: Every day | ORAL | 0 refills | Status: DC

## 2024-06-16 MED ORDER — FUROSEMIDE 20 MG PO TABS: 20.0000 mg | ORAL_TABLET | Freq: Every day | ORAL | 0 refills | Status: AC

## 2024-06-16 MED ORDER — FUROSEMIDE 20 MG PO TABS: 20.0000 mg | ORAL_TABLET | Freq: Every day | ORAL | 0 refills | Status: DC

## 2024-06-16 MED ORDER — POTASSIUM CHLORIDE CRYS ER 20 MEQ PO TBCR: 20.0000 meq | EXTENDED_RELEASE_TABLET | Freq: Every day | ORAL | 0 refills | Status: AC

## 2024-06-16 MED ORDER — LOSARTAN POTASSIUM 50 MG PO TABS: 50.0000 mg | ORAL_TABLET | Freq: Every day | ORAL | 1 refills | Status: DC

## 2024-06-16 MED ORDER — LOSARTAN POTASSIUM 50 MG PO TABS: 50.0000 mg | ORAL_TABLET | Freq: Every day | ORAL | 1 refills | Status: AC

## 2024-06-16 NOTE — Progress Notes (Signed)
 Subjective:     Gina Lucas is a 29 y.o. female who presents to the clinic 1 weeks status post low uterine, transverse cesarean section. Pt reports incision is healing well.      Objective:    BP 138/89 (BP Location: Right Arm, Patient Position: Sitting)   Pulse 81   Ht 5' 7 (1.702 m)   Wt 295 lb (133.8 kg)   LMP 09/23/2023   Breastfeeding No   BMI 46.20 kg/m  General:  alert, well appearing, in no apparent distress  Incision:   healing well, no drainage, no erythema, no hernia, no seroma, no swelling, no dehiscence, incision well approximated     Assessment:    Doing well postoperatively.   Plan:    1. Continue any current medications. 2. Wound care discussed. 3. Follow up: PP appointment.   Erminio DELENA Rumps, RN   Subjective:  Gina Lucas is a [id number] here for BP check.  She is 7 days postpartum following a primary cesarean section.    Hypertension ROS: Patient has headache   Objective:  BP 138/89 (BP Location: Right Arm, Patient Position: Sitting)   Pulse 81   Ht 5' 7 (1.702 m)   Wt 295 lb (133.8 kg)   LMP 09/23/2023   Breastfeeding No   BMI 46.20 kg/m   Appearance alert, well appearing, and in no distress and oriented to person, place, and time.  Assessment:   Blood Pressure borderline controlled.   Plan:  Orders and follow up as documented in patient record.  Patient will discontinue Nifedipine  and start Cozaar .  Will continue Lasix  and K+ for 1 additional week.  Will set up appointment with [facility] to assist with Type 2 Diabetes.     Erminio DELENA Rumps, RN

## 2024-07-15 ENCOUNTER — Ambulatory Visit: Admitting: Physician Assistant

## 2024-07-22 ENCOUNTER — Ambulatory Visit: Admitting: Family Medicine
